# Patient Record
Sex: Male | Born: 1970 | Race: Black or African American | Hispanic: No | Marital: Single | Smoking: Current every day smoker
Health system: Southern US, Community
[De-identification: ages and names within clinical notes are randomized; demographics above are authoritative.]

## PROBLEM LIST (undated history)

## (undated) DIAGNOSIS — N200 Calculus of kidney: Secondary | ICD-10-CM

## (undated) DIAGNOSIS — J45909 Unspecified asthma, uncomplicated: Secondary | ICD-10-CM

---

## 2013-02-02 ENCOUNTER — Emergency Department (HOSPITAL_COMMUNITY): Payer: Self-pay

## 2013-02-02 ENCOUNTER — Inpatient Hospital Stay (HOSPITAL_COMMUNITY)
Admission: EM | Admit: 2013-02-02 | Discharge: 2013-02-04 | DRG: 501 | Disposition: A | Discharge status: Home / Self Care | Payer: MEDICAID | Attending: Emergency Medicine | Admitting: Emergency Medicine

## 2013-02-02 ENCOUNTER — Emergency Department (HOSPITAL_COMMUNITY): Payer: Self-pay | Admitting: Anesthesiology

## 2013-02-02 ENCOUNTER — Encounter (HOSPITAL_COMMUNITY): Admission: EM | Disposition: A | Discharge status: Home / Self Care | Payer: Self-pay | Source: Home / Self Care

## 2013-02-02 ENCOUNTER — Encounter (HOSPITAL_COMMUNITY): Payer: Self-pay

## 2013-02-02 ENCOUNTER — Encounter (HOSPITAL_COMMUNITY): Payer: Self-pay | Admitting: Anesthesiology

## 2013-02-02 DIAGNOSIS — W268XXA Contact with other sharp object(s), not elsewhere classified, initial encounter: Secondary | ICD-10-CM | POA: Diagnosis present

## 2013-02-02 DIAGNOSIS — S6000XA Contusion of unspecified finger without damage to nail, initial encounter: Secondary | ICD-10-CM | POA: Diagnosis present

## 2013-02-02 DIAGNOSIS — S61209A Unspecified open wound of unspecified finger without damage to nail, initial encounter: Secondary | ICD-10-CM | POA: Diagnosis present

## 2013-02-02 DIAGNOSIS — IMO0002 Reserved for concepts with insufficient information to code with codable children: Principal | ICD-10-CM | POA: Diagnosis present

## 2013-02-02 DIAGNOSIS — F17200 Nicotine dependence, unspecified, uncomplicated: Secondary | ICD-10-CM | POA: Diagnosis present

## 2013-02-02 DIAGNOSIS — S65509A Unspecified injury of blood vessel of unspecified finger, initial encounter: Secondary | ICD-10-CM | POA: Diagnosis present

## 2013-02-02 DIAGNOSIS — S62502B Fracture of unspecified phalanx of left thumb, initial encounter for open fracture: Secondary | ICD-10-CM

## 2013-02-02 DIAGNOSIS — S62319A Displaced fracture of base of unspecified metacarpal bone, initial encounter for closed fracture: Secondary | ICD-10-CM | POA: Diagnosis present

## 2013-02-02 HISTORY — DX: Unspecified asthma, uncomplicated: J45.909

## 2013-02-02 HISTORY — PX: OPEN REDUCTION INTERNAL FIXATION (ORIF) METACARPAL: SHX6234

## 2013-02-02 HISTORY — PX: NERVE, TENDON AND ARTERY REPAIR: SHX5695

## 2013-02-02 HISTORY — PX: INCISION AND DRAINAGE: SHX5863

## 2013-02-02 LAB — BASIC METABOLIC PANEL
BUN: 14 mg/dL (ref 6–23)
CO2: 26 mEq/L (ref 19–32)
GFR calc non Af Amer: >90 mL/min (ref >90)
Glucose, Bld: 95 mg/dL (ref 70–99)
Potassium: 4.1 mEq/L (ref 3.5–5.1)

## 2013-02-02 LAB — CBC
HCT: 37.6 % — ABNORMAL LOW (ref 39.0–52.0)
Hemoglobin: 12.9 g/dL — ABNORMAL LOW (ref 13.0–17.0)
MCH: 23 pg — ABNORMAL LOW (ref 26.0–34.0)
MCHC: 34.3 g/dL (ref 30.0–36.0)
RBC: 5.61 MIL/uL (ref 4.22–5.81)

## 2013-02-02 SURGERY — INCISION AND DRAINAGE
Anesthesia: General | Site: Thumb | Laterality: Left | Wound class: Dirty or Infected

## 2013-02-02 MED ORDER — SODIUM CHLORIDE 0.9 % IV BOLUS (SEPSIS)
1000.0000 mL | Freq: Once | INTRAVENOUS | Status: AC
  Administered 2013-02-02: 1000 mL via INTRAVENOUS

## 2013-02-02 MED ORDER — KETOROLAC TROMETHAMINE 30 MG/ML IJ SOLN
15.0000 mg | Freq: Once | INTRAMUSCULAR | Status: AC | PRN
  Administered 2013-02-02: 30 mg via INTRAVENOUS

## 2013-02-02 MED ORDER — CEFAZOLIN SODIUM-DEXTROSE 2-3 GM-% IV SOLR
2.0000 g | Freq: Once | INTRAVENOUS | Status: AC
  Administered 2013-02-02: 2 g via INTRAVENOUS
  Filled 2013-02-02: qty 50

## 2013-02-02 MED ORDER — MORPHINE SULFATE 4 MG/ML IJ SOLN: 4.0000 mg | Freq: Once | INTRAMUSCULAR | Status: DC

## 2013-02-02 MED ORDER — ALPRAZOLAM 0.5 MG PO TABS: 0.5000 mg | ORAL_TABLET | Freq: Four times a day (QID) | ORAL | Status: DC | PRN

## 2013-02-02 MED ORDER — PROMETHAZINE HCL 12.5 MG RE SUPP
12.5000 mg | Freq: Four times a day (QID) | RECTAL | Status: DC | PRN
  Filled 2013-02-02: qty 1

## 2013-02-02 MED ORDER — FENTANYL CITRATE 0.05 MG/ML IJ SOLN: 25.0000 ug | INTRAMUSCULAR | Status: DC | PRN

## 2013-02-02 MED ORDER — MIDAZOLAM HCL 5 MG/5ML IJ SOLN
INTRAMUSCULAR | Status: DC | PRN
  Administered 2013-02-02: 1 mg via INTRAVENOUS

## 2013-02-02 MED ORDER — CEFAZOLIN SODIUM 1-5 GM-% IV SOLN: 1.0000 g | INTRAVENOUS | Status: DC

## 2013-02-02 MED ORDER — TETANUS-DIPHTH-ACELL PERTUSSIS 5-2.5-18.5 LF-MCG/0.5 IM SUSP
0.5000 mL | Freq: Once | INTRAMUSCULAR | Status: AC
  Administered 2013-02-02: 0.5 mL via INTRAMUSCULAR
  Filled 2013-02-02 (×2): qty 0.5

## 2013-02-02 MED ORDER — ONDANSETRON HCL 4 MG PO TABS: 4.0000 mg | ORAL_TABLET | Freq: Four times a day (QID) | ORAL | Status: DC | PRN

## 2013-02-02 MED ORDER — FAMOTIDINE 20 MG PO TABS
20.0000 mg | ORAL_TABLET | Freq: Two times a day (BID) | ORAL | Status: DC | PRN
  Filled 2013-02-02: qty 1

## 2013-02-02 MED ORDER — CEFAZOLIN SODIUM 1-5 GM-% IV SOLN
1.0000 g | Freq: Three times a day (TID) | INTRAVENOUS | Status: DC
  Administered 2013-02-03 – 2013-02-04 (×5): 1 g via INTRAVENOUS
  Filled 2013-02-02 (×6): qty 50

## 2013-02-02 MED ORDER — BUPIVACAINE HCL (PF) 0.5 % IJ SOLN
INTRAMUSCULAR | Status: AC
  Filled 2013-02-02: qty 60

## 2013-02-02 MED ORDER — PROPOFOL 10 MG/ML IV BOLUS
INTRAVENOUS | Status: DC | PRN
  Administered 2013-02-02: 25 mg via INTRAVENOUS
  Administered 2013-02-02: 175 mg via INTRAVENOUS
  Administered 2013-02-02: 20 mg via INTRAVENOUS
  Administered 2013-02-02: 25 mg via INTRAVENOUS

## 2013-02-02 MED ORDER — MORPHINE SULFATE 4 MG/ML IJ SOLN
4.0000 mg | Freq: Once | INTRAMUSCULAR | Status: AC
  Administered 2013-02-02: 4 mg via INTRAVENOUS
  Filled 2013-02-02: qty 1

## 2013-02-02 MED ORDER — GENTAMICIN SULFATE 40 MG/ML IJ SOLN
490.0000 mg | INTRAVENOUS | Status: AC
  Administered 2013-02-02 – 2013-02-03 (×2): 490 mg via INTRAVENOUS
  Filled 2013-02-02 (×3): qty 12.25

## 2013-02-02 MED ORDER — DEXTRAN 40 IN D5W 10 % IV SOLN
8.3000 mL/h | INTRAVENOUS | Status: DC
  Filled 2013-02-02: qty 500

## 2013-02-02 MED ORDER — OXYCODONE HCL 5 MG PO TABS
5.0000 mg | ORAL_TABLET | ORAL | Status: DC | PRN
  Administered 2013-02-03 – 2013-02-04 (×5): 10 mg via ORAL
  Filled 2013-02-02 (×5): qty 2

## 2013-02-02 MED ORDER — EPHEDRINE SULFATE 50 MG/ML IJ SOLN
INTRAMUSCULAR | Status: DC | PRN
  Administered 2013-02-02: 5 mg via INTRAVENOUS

## 2013-02-02 MED ORDER — LACTATED RINGERS IV SOLN
INTRAVENOUS | Status: DC | PRN
  Administered 2013-02-02: 18:00:00 via INTRAVENOUS

## 2013-02-02 MED ORDER — NEOSTIGMINE METHYLSULFATE 1 MG/ML IJ SOLN
INTRAMUSCULAR | Status: DC | PRN
  Administered 2013-02-02: 5 mg via INTRAVENOUS

## 2013-02-02 MED ORDER — PROMETHAZINE HCL 25 MG/ML IJ SOLN: 6.2500 mg | INTRAMUSCULAR | Status: DC | PRN

## 2013-02-02 MED ORDER — CISATRACURIUM BESYLATE (PF) 10 MG/5ML IV SOLN
INTRAVENOUS | Status: DC | PRN
  Administered 2013-02-02: 6 mg via INTRAVENOUS

## 2013-02-02 MED ORDER — SODIUM CHLORIDE 0.9 % IV SOLN
INTRAVENOUS | Status: DC | PRN
  Administered 2013-02-02: 17:00:00 via INTRAVENOUS

## 2013-02-02 MED ORDER — LIDOCAINE HCL (CARDIAC) 20 MG/ML IV SOLN
INTRAVENOUS | Status: DC | PRN
  Administered 2013-02-02: 75 mg via INTRAVENOUS

## 2013-02-02 MED ORDER — CEFAZOLIN SODIUM 1-5 GM-% IV SOLN
1.0000 g | Freq: Once | INTRAVENOUS | Status: AC
  Administered 2013-02-02: 1 g via INTRAVENOUS
  Filled 2013-02-02: qty 50

## 2013-02-02 MED ORDER — LIDOCAINE HCL 2 % IJ SOLN
INTRAMUSCULAR | Status: AC
  Filled 2013-02-02: qty 40

## 2013-02-02 MED ORDER — ACETAMINOPHEN 10 MG/ML IV SOLN
INTRAVENOUS | Status: DC | PRN
  Administered 2013-02-02: 1000 mg via INTRAVENOUS

## 2013-02-02 MED ORDER — KETOROLAC TROMETHAMINE 30 MG/ML IJ SOLN
INTRAMUSCULAR | Status: AC
  Filled 2013-02-02: qty 1

## 2013-02-02 MED ORDER — GLYCOPYRROLATE 0.2 MG/ML IJ SOLN
INTRAMUSCULAR | Status: DC | PRN
  Administered 2013-02-02: .9 mg via INTRAVENOUS

## 2013-02-02 MED ORDER — METOCLOPRAMIDE HCL 5 MG/ML IJ SOLN
INTRAMUSCULAR | Status: DC | PRN
  Administered 2013-02-02: 5 mg via INTRAVENOUS

## 2013-02-02 MED ORDER — SODIUM CHLORIDE 0.9 % IR SOLN
Status: DC | PRN
  Administered 2013-02-02: 18:00:00

## 2013-02-02 MED ORDER — FENTANYL CITRATE 0.05 MG/ML IJ SOLN
INTRAMUSCULAR | Status: DC | PRN
  Administered 2013-02-02 (×2): 100 ug via INTRAVENOUS

## 2013-02-02 MED ORDER — LACTATED RINGERS IV SOLN: INTRAVENOUS | Status: DC

## 2013-02-02 MED ORDER — ONDANSETRON HCL 4 MG/2ML IJ SOLN: 4.0000 mg | Freq: Four times a day (QID) | INTRAMUSCULAR | Status: DC | PRN

## 2013-02-02 MED ORDER — ACETAMINOPHEN 10 MG/ML IV SOLN
INTRAVENOUS | Status: AC
  Filled 2013-02-02: qty 100

## 2013-02-02 MED ORDER — CEFAZOLIN SODIUM-DEXTROSE 2-3 GM-% IV SOLR
INTRAVENOUS | Status: AC
  Filled 2013-02-02: qty 50

## 2013-02-02 MED ORDER — SUCCINYLCHOLINE CHLORIDE 20 MG/ML IJ SOLN
INTRAMUSCULAR | Status: DC | PRN
  Administered 2013-02-02: 120 mg via INTRAVENOUS

## 2013-02-02 MED ORDER — VITAMIN C 500 MG PO TABS
1000.0000 mg | ORAL_TABLET | Freq: Every day | ORAL | Status: DC
  Administered 2013-02-03 – 2013-02-04 (×2): 1000 mg via ORAL
  Filled 2013-02-02 (×2): qty 2

## 2013-02-02 MED ORDER — SODIUM CHLORIDE 0.9 % IV BOLUS (SEPSIS): 500.0000 mL | Freq: Once | INTRAVENOUS | Status: DC

## 2013-02-02 MED ORDER — BACITRACIN-NEOMYCIN-POLYMYXIN 400-5-5000 EX OINT
TOPICAL_OINTMENT | CUTANEOUS | Status: AC
  Filled 2013-02-02: qty 1

## 2013-02-02 MED ORDER — DOCUSATE SODIUM 100 MG PO CAPS
100.0000 mg | ORAL_CAPSULE | Freq: Two times a day (BID) | ORAL | Status: DC
  Administered 2013-02-02 – 2013-02-04 (×4): 100 mg via ORAL

## 2013-02-02 MED ORDER — ONDANSETRON HCL 4 MG/2ML IJ SOLN
INTRAMUSCULAR | Status: DC | PRN
  Administered 2013-02-02: 4 mg via INTRAVENOUS

## 2013-02-02 MED ORDER — DEXAMETHASONE SODIUM PHOSPHATE 10 MG/ML IJ SOLN
INTRAMUSCULAR | Status: DC | PRN
  Administered 2013-02-02: 10 mg via INTRAVENOUS

## 2013-02-02 MED ORDER — METHOCARBAMOL 100 MG/ML IJ SOLN
500.0000 mg | Freq: Four times a day (QID) | INTRAVENOUS | Status: DC | PRN
  Filled 2013-02-02: qty 5

## 2013-02-02 MED ORDER — MORPHINE SULFATE 10 MG/ML IJ SOLN: 1.0000 mg | INTRAMUSCULAR | Status: DC | PRN

## 2013-02-02 MED ORDER — ASPIRIN 325 MG PO TABS
325.0000 mg | ORAL_TABLET | Freq: Two times a day (BID) | ORAL | Status: DC
  Administered 2013-02-02 – 2013-02-04 (×4): 325 mg via ORAL
  Filled 2013-02-02 (×6): qty 1

## 2013-02-02 MED ORDER — POTASSIUM CHLORIDE 2 MEQ/ML IV SOLN
INTRAVENOUS | Status: DC
  Administered 2013-02-02: 21:00:00 via INTRAVENOUS
  Filled 2013-02-02 (×6): qty 1000

## 2013-02-02 MED ORDER — MORPHINE SULFATE 2 MG/ML IJ SOLN
1.0000 mg | INTRAMUSCULAR | Status: DC | PRN
  Administered 2013-02-03: 1 mg via INTRAVENOUS
  Filled 2013-02-02: qty 1

## 2013-02-02 MED ORDER — METHOCARBAMOL 500 MG PO TABS
500.0000 mg | ORAL_TABLET | Freq: Four times a day (QID) | ORAL | Status: DC | PRN
  Administered 2013-02-04: 500 mg via ORAL
  Filled 2013-02-02: qty 1

## 2013-02-02 SURGICAL SUPPLY — 71 items
BAG ZIPLOCK 12X15 (MISCELLANEOUS) ×2 IMPLANT
BANDAGE CONFORM 3  STR LF (GAUZE/BANDAGES/DRESSINGS) ×2 IMPLANT
BANDAGE ELASTIC 3 VELCRO ST LF (GAUZE/BANDAGES/DRESSINGS) ×4 IMPLANT
BANDAGE ELASTIC 4 VELCRO ST LF (GAUZE/BANDAGES/DRESSINGS) IMPLANT
BANDAGE GAUZE ELAST BULKY 4 IN (GAUZE/BANDAGES/DRESSINGS) ×2 IMPLANT
BLADE OSCILLATING/SAGITTAL (BLADE) ×1
BLADE SURG SZ10 CARB STEEL (BLADE) ×2 IMPLANT
BLADE SW THK.38XMED LNG THN (BLADE) ×1 IMPLANT
CLOTH BEACON ORANGE TIMEOUT ST (SAFETY) ×2 IMPLANT
CORDS BIPOLAR (ELECTRODE) ×2 IMPLANT
CUFF TOURN SGL QUICK 18 (TOURNIQUET CUFF) ×2 IMPLANT
CUFF TOURN SGL QUICK 34 (TOURNIQUET CUFF) ×1
CUFF TRNQT CYL 34X4X40X1 (TOURNIQUET CUFF) ×1 IMPLANT
DECANTER SPIKE VIAL GLASS SM (MISCELLANEOUS) ×2 IMPLANT
DEPRESSOR TONGUE BLADE STERILE (MISCELLANEOUS) ×2 IMPLANT
DRAIN PENROSE 18X1/2 LTX STRL (DRAIN) IMPLANT
DRAPE LG THREE QUARTER DISP (DRAPES) ×2 IMPLANT
DRAPE OEC MINIVIEW 54X84 (DRAPES) ×2 IMPLANT
DRAPE SURG 17X11 SM STRL (DRAPES) ×2 IMPLANT
DRAPE U-SHAPE 47X51 STRL (DRAPES) ×2 IMPLANT
DRSG ADAPTIC 3X8 NADH LF (GAUZE/BANDAGES/DRESSINGS) ×2 IMPLANT
DRSG EMULSION OIL 3X16 NADH (GAUZE/BANDAGES/DRESSINGS) ×2 IMPLANT
DRSG PAD ABDOMINAL 8X10 ST (GAUZE/BANDAGES/DRESSINGS) ×4 IMPLANT
ELECT REM PT RETURN 9FT ADLT (ELECTROSURGICAL) ×2
ELECTRODE REM PT RTRN 9FT ADLT (ELECTROSURGICAL) ×1 IMPLANT
GAUZE SPONGE 4X4 16PLY XRAY LF (GAUZE/BANDAGES/DRESSINGS) ×2 IMPLANT
GAUZE XEROFORM 1X8 LF (GAUZE/BANDAGES/DRESSINGS) ×2 IMPLANT
GAUZE XEROFORM 4X4 STRL (GAUZE/BANDAGES/DRESSINGS) ×2 IMPLANT
GLOVE BIO SURGEON STRL SZ8 (GLOVE) ×2 IMPLANT
GLOVE BIOGEL M STRL SZ7.5 (GLOVE) ×2 IMPLANT
GOWN STRL REIN XL XLG (GOWN DISPOSABLE) ×2 IMPLANT
K-WIRE .45 DIA 4 LENGTH (WIRE) ×4 IMPLANT
KIT BASIN OR (CUSTOM PROCEDURE TRAY) ×2 IMPLANT
KWIRE 4.0 X .035IN (WIRE) ×4 IMPLANT
MANIFOLD NEPTUNE II (INSTRUMENTS) IMPLANT
NEEDLE HYPO 25X1 1.5 SAFETY (NEEDLE) ×2 IMPLANT
NS IRRIG 1000ML POUR BTL (IV SOLUTION) ×2 IMPLANT
PACK LOWER EXTREMITY WL (CUSTOM PROCEDURE TRAY) ×2 IMPLANT
PAD CAST 3X4 CTTN HI CHSV (CAST SUPPLIES) ×1 IMPLANT
PAD CAST 4YDX4 CTTN HI CHSV (CAST SUPPLIES) ×2 IMPLANT
PADDING CAST COTTON 3X4 STRL (CAST SUPPLIES) ×1
PADDING CAST COTTON 4X4 STRL (CAST SUPPLIES) ×2
PASSER SUT SWANSON 36MM LOOP (INSTRUMENTS) ×2 IMPLANT
POSITIONER SURGICAL ARM (MISCELLANEOUS) ×2 IMPLANT
SOL PREP POV-IOD 16OZ 10% (MISCELLANEOUS) IMPLANT
SOL PREP PROV IODINE SCRUB 4OZ (MISCELLANEOUS) IMPLANT
SPEAR EYE SURGICAL ST (MISCELLANEOUS) ×2 IMPLANT
SPLINT FIBERGLASS 4X15 (CAST SUPPLIES) ×2 IMPLANT
SPONGE GAUZE 4X4 12PLY (GAUZE/BANDAGES/DRESSINGS) ×2 IMPLANT
STOCKINETTE 4X48 STRL (DRAPES) ×2 IMPLANT
STOCKINETTE TUBULAR SYNTH 3IN (CAST SUPPLIES) ×2 IMPLANT
SUT CHROMIC 5 0 P 3 (SUTURE) ×2 IMPLANT
SUT ETHILON 9 0 V 100.4 (SUTURE) IMPLANT
SUT FIBERWIRE 4-0 18 DIAM BLUE (SUTURE) ×2
SUT MERSILENE 4 0 P 3 (SUTURE) IMPLANT
SUT PROLENE 3 0 PS 2 (SUTURE) IMPLANT
SUT PROLENE 4 0 PS 2 18 (SUTURE) IMPLANT
SUT PROLENE 5 0 P 3 (SUTURE) ×2 IMPLANT
SUT SILK 2 0 (SUTURE)
SUT SILK 2-0 18XBRD TIE 12 (SUTURE) IMPLANT
SUT VIC AB 1 CT1 27 (SUTURE)
SUT VIC AB 1 CT1 27XBRD ANTBC (SUTURE) IMPLANT
SUT VIC AB 2-0 CT1 27 (SUTURE)
SUT VIC AB 2-0 CT1 27XBRD (SUTURE) IMPLANT
SUT VIC AB 2-0 CT1 TAPERPNT 27 (SUTURE) IMPLANT
SUTURE FIBERWR 4-0 18 DIA BLUE (SUTURE) ×1 IMPLANT
SYR 20CC LL (SYRINGE) ×2 IMPLANT
SYR CONTROL 10ML LL (SYRINGE) IMPLANT
TOWEL OR 17X26 10 PK STRL BLUE (TOWEL DISPOSABLE) ×2 IMPLANT
TRAY PREP A LATEX SAFE STRL (SET/KITS/TRAYS/PACK) ×2 IMPLANT
WATER STERILE IRR 1500ML POUR (IV SOLUTION) ×2 IMPLANT

## 2013-02-02 NOTE — Transfer of Care (Signed)
Immediate Anesthesia Transfer of Care Note  Patient: Jimmy Mcbride  Procedure(s) Performed: Procedure(s) (LRB): INCISION AND dedribment (Left) OPEN REDUCTION INTERNAL FIXATION (ORIF) proximal thumb (Left) epl  TENDON repair (Left)  Patient Location: PACU  Anesthesia Type: General  Level of Consciousness: sedated, patient cooperative and responds to stimulaton  Airway & Oxygen Therapy: Patient Spontanous Breathing and Patient connected to face mask oxgen  Post-op Assessment: Report given to PACU RN and Post -op Vital signs reviewed and stable  Post vital signs: Reviewed and stable  Complications: No apparent anesthesia complications

## 2013-02-02 NOTE — Op Note (Signed)
Dictation # [id number] Dominica Severin MD

## 2013-02-02 NOTE — Anesthesia Postprocedure Evaluation (Signed)
  Anesthesia Post-op Note  Patient: Jimmy Mcbride  Procedure(s) Performed: Procedure(s) (LRB): INCISION AND dedribment (Left) OPEN REDUCTION INTERNAL FIXATION (ORIF) proximal thumb (Left) epl  TENDON repair (Left)  Patient Location: PACU  Anesthesia Type: General  Level of Consciousness: awake and alert   Airway and Oxygen Therapy: Patient Spontanous Breathing  Post-op Pain: mild  Post-op Assessment: Post-op Vital signs reviewed, Patient's Cardiovascular Status Stable, Respiratory Function Stable, Patent Airway and No signs of Nausea or vomiting  Last Vitals:  Filed Vitals:   02/02/13 1900  BP:   Pulse:   Temp: 36.4 C  Resp:     Post-op Vital Signs: stable   Complications: No apparent anesthesia complications

## 2013-02-02 NOTE — Progress Notes (Signed)
ANTIBIOTIC CONSULT NOTE - INITIAL  Pharmacy Consult for Vancomycin Indication: post-op wound contamination (x48h only)  Allergies  Allergen Reactions  . Penicillins Hives    Patient Measurements: Height: 5\' 7"  (170.2 cm) Weight: 155 lb (70.308 kg) IBW/kg (Calculated) : 66.1  Vital Signs: Temp: 98 F (36.7 C) (05/27 2036) Temp src: Oral (05/27 1355) BP: 112/72 mmHg (05/27 2036) Pulse Rate: 54 (05/27 2036) Intake/Output from previous day:   Intake/Output from this shift:    Labs:  Recent Labs  02/02/13 1240  WBC 7.3  HGB 12.9*  PLT 235  CREATININE 0.84   Estimated Creatinine Clearance: 109.3 ml/min (by C-G formula based on Cr of 0.84). No results found for this basename: VANCOTROUGH, VANCOPEAK, VANCORANDOM, GENTTROUGH, GENTPEAK, GENTRANDOM, TOBRATROUGH, TOBRAPEAK, TOBRARND, AMIKACINPEAK, AMIKACINTROU, AMIKACIN,  in the last 72 hours   Microbiology: No results found for this or any previous visit (from the past 720 hour(s)).  Medical History: Past Medical History  Diagnosis Date  . Asthma     in childhood    Assessment: 42 yom with open fracture of L thumb with tendon injury and complex wound presented 5/27 for I&D, ORIF of proximal thumb and tendon repair. MD ordered for 48 hours of gentamicin for contamination of wound post-op.   Pt wts 70.3 kg, CrCl > 100 ml/min. Afebrile, WBC wnl, no cultures available   Plan:   Gentamicin 490 mg (7mg /kg) daily x 2 doses   Pharmacy will f/u  Geoffry Paradise, PharmD, BCPS Pager: [phone] 8:49 PM Pharmacy #: [id number]

## 2013-02-02 NOTE — ED Provider Notes (Signed)
Medical screening examination/treatment/procedure(s) were conducted as a shared visit with non-physician practitioner(s) and myself.  I personally evaluated the patient during the encounter.   Patient with crush injury to resulting in comminuted fracture of the proximal phalanx with a large overlying laceration indicating open fracture. Patient will require surgical intervention.  Gilda Crease, MD 02/02/13 1540

## 2013-02-02 NOTE — Progress Notes (Signed)
All pacu notes were charted by Deiondre Harrower rn as pt caregiver.

## 2013-02-02 NOTE — H&P (Signed)
Jimmy Mcbride is an 42 y.o. male.   Chief Complaint: Open fracture left thumb with tendon injury and complex wound HPI: Patient presents with an open left thumb fracture and soft tissues in disarray. Patient notes severe pain he denies other injury. I've reviewed all pertinent issues x-rays et Karie Soda. The injury happened late this morning. Marland Kitchen.Patient presents for evaluation and treatment of the of their upper extremity predicament. The patient denies neck back chest or of abdominal pain. The patient notes that they have no lower extremity problems. The patient from primarily complains of the upper extremity pain noted.  Past Medical History  Diagnosis Date  . Asthma     in childhood    History reviewed. No pertinent past surgical history.  History reviewed. No pertinent family history. Social History:  reports that he has been smoking Cigars.  He has never used smokeless tobacco. He reports that he uses illicit drugs (Marijuana). He reports that he does not drink alcohol.  Allergies:  Allergies  Allergen Reactions  . Penicillins Hives     (Not in a hospital admission)  Results for orders placed during the hospital encounter of 02/02/13 (from the past 48 hour(s))  CBC     Status: Abnormal   Collection Time    02/02/13 12:40 PM      Result Value Range   WBC 7.3  4.0 - 10.5 K/uL   RBC 5.61  4.22 - 5.81 MIL/uL   Hemoglobin 12.9 (*) 13.0 - 17.0 g/dL   HCT 16.1 (*) 09.6 - 04.5 %   MCV 67.0 (*) 78.0 - 100.0 fL   MCH 23.0 (*) 26.0 - 34.0 pg   MCHC 34.3  30.0 - 36.0 g/dL   RDW 40.9  81.1 - 91.4 %   Platelets 235  150 - 400 K/uL  BASIC METABOLIC PANEL     Status: None   Collection Time    02/02/13 12:40 PM      Result Value Range   Sodium 139  135 - 145 mEq/L   Potassium 4.1  3.5 - 5.1 mEq/L   Chloride 104  96 - 112 mEq/L   CO2 26  19 - 32 mEq/L   Glucose, Bld 95  70 - 99 mg/dL   BUN 14  6 - 23 mg/dL   Creatinine, Ser 7.82  0.50 - 1.35 mg/dL   Calcium 9.2  8.4 - 95.6 mg/dL    GFR calc non Af Amer >90  >90 mL/min   GFR calc Af Amer >90  >90 mL/min   Comment:            The eGFR has been calculated     using the CKD EPI equation.     This calculation has not been     validated in all clinical     situations.     eGFR's persistently     <90 mL/min signify     possible Chronic Kidney Disease.   Dg Chest 2 View  02/02/2013   *RADIOLOGY REPORT*  Clinical Data: Preoperative evaluation, smoker  CHEST - 2 VIEW  Comparison:  None.  Findings:  The heart size and mediastinal contours are within normal limits.  Both lungs are clear.  The visualized skeletal structures are unremarkable.  IMPRESSION: No active cardiopulmonary disease.   Original Report Authenticated By: Judie Petit. Miles Costain, M.D.   Dg Finger Thumb Left  02/02/2013   *RADIOLOGY REPORT*  Clinical Data: History of injury.  LEFT THUMB 2+V  Comparison: None.  Findings: There  is a comminuted fracture of the proximal phalanx of the thumb.  The fracture has a longitudinal component extending proximally to the articular surface.  There is some angulation of the major distal fracture fragment.  There is slight distal displacement of major distal fracture fragment.  There is displacement of small comminuted fracture fragments. No dislocation.  There is evidence of soft tissue injury with small focal areas of debris either on the skin or in the superficial soft tissues.  There is soft tissue swelling.  IMPRESSION: Comminuted fracture of the proximal phalanx of thumb. No dislocation.  Soft tissue injury.  Debris either on the skin or in the superficial soft tissues.  There is soft tissue swelling.   Original Report Authenticated By: Onalee Hua Call    Review of Systems  Constitutional: Negative.   HENT: Negative.   Eyes: Negative.   Respiratory: Negative.   Cardiovascular: Negative.   Gastrointestinal: Negative.   Genitourinary: Negative.   Neurological: Negative.   Endo/Heme/Allergies: Negative.   Psychiatric/Behavioral: Negative.      Blood pressure 124/79, pulse 53, temperature 97.7 F (36.5 C), temperature source Oral, resp. rate 16, height 5\' 7"  (1.702 m), weight 70.308 kg (155 lb), SpO2 100.00%. Physical Exam ..The patient is alert and oriented in no acute distress the patient complains of pain in the affected upper extremity.  The patient is noted to have a normal HEENT exam.  Lung fields show equal chest expansion and no shortness of breath  abdomen exam is nontender without distention.  Lower extremity examination does not show any fracture dislocation or blood clot symptoms.  Pelvis is stable neck and back are stable and nontender   patient has an open left thumb fracture with soft tissue disarray. He has refill to the tip of the thumb and some degree of sensory abnormality. He has no evidence of forearm arm or elbow injury. X-ray show very comminuted complex fracture. I reviewed this with him at length.      Assessment/Plan .Marland KitchenWe are planning surgery for your upper extremity. The risk and benefits of surgery include risk of bleeding infection anesthesia damage to normal structures and failure of the surgery to accomplish its intended goals of relieving symptoms and restoring function with this in mind we'll going to proceed. I have specifically discussed with the patient the pre-and postoperative regime and the does and don'ts and risk and benefits in great detail. Risk and benefits of surgery also include risk of dystrophy chronic nerve pain failure of the healing process to go onto completion and other inherent risks of surgery  The relavent the pathophysiology of the disease/injury process, as well as the alternatives for treatment and postoperative course of action has been discussed in great detail with the patient who desires to proceed.  We will do everything in our power to help you (the patient) restore function to the upper extremity. Is a pleasure to see this patient today.   Karen Chafe 02/02/2013, 4:33 PM

## 2013-02-02 NOTE — Progress Notes (Signed)
[facility] has seen patient and provided him with a list of primary care resources. °

## 2013-02-02 NOTE — Anesthesia Preprocedure Evaluation (Addendum)
Anesthesia Evaluation  Patient identified by MRN, date of birth, ID band Patient awake    Reviewed: Allergy & Precautions, H&P , NPO status , Patient's Chart, lab work & pertinent test results  Airway Mallampati: II TM Distance: >3 FB Neck ROM: Full    Dental no notable dental hx.    Pulmonary Current Smoker,  breath sounds clear to auscultation  Pulmonary exam normal       Cardiovascular negative cardio ROS  Rhythm:Regular Rate:Normal     Neuro/Psych negative neurological ROS  negative psych ROS   GI/Hepatic negative GI ROS, Neg liver ROS,   Endo/Other  negative endocrine ROS  Renal/GU negative Renal ROS  negative genitourinary   Musculoskeletal negative musculoskeletal ROS (+)   Abdominal   Peds negative pediatric ROS (+)  Hematology negative hematology ROS (+)   Anesthesia Other Findings   Reproductive/Obstetrics negative OB ROS                           Anesthesia Physical Anesthesia Plan  ASA: II and emergent  Anesthesia Plan: General   Post-op Pain Management:    Induction: Intravenous and Rapid sequence  Airway Management Planned: Oral ETT  Additional Equipment:   Intra-op Plan:   Post-operative Plan: Extubation in OR  Informed Consent: I have reviewed the patients History and Physical, chart, labs and discussed the procedure including the risks, benefits and alternatives for the proposed anesthesia with the patient or authorized representative who has indicated his/her understanding and acceptance.   Dental advisory given  Plan Discussed with: CRNA and Surgeon  Anesthesia Plan Comments:         Anesthesia Quick Evaluation  

## 2013-02-02 NOTE — Preoperative (Signed)
Beta Blockers   Reason not to administer Beta Blockers:Not Applicable 

## 2013-02-02 NOTE — ED Provider Notes (Signed)
History     CSN: [id number]  Arrival date & time 02/02/13  1043   First MD Initiated Contact with Patient 02/02/13 1149      Chief Complaint  Patient presents with  . thumb injury     (Consider location/radiation/quality/duration/timing/severity/associated sxs/prior treatment) HPI Comments: 42 year old male with no significant past medical history presents to the emergency department with an injury to his left thumb that occurred prior to arrival. Patient states that he was moving about 80 pounds of metal when he slipped in causing the melphalan onto his left thumb. Currently he is in severe pain rated 10 out of 10. Unsure of last tetanus shot. He has not had any alleviating factors for his pain. Denies lightheadedness, dizziness, nausea or vomiting.  The history is provided by the patient.    History reviewed. No pertinent past medical history.  History reviewed. No pertinent past surgical history.  History reviewed. No pertinent family history.  History  Substance Use Topics  . Smoking status: Current Every Day Smoker -- 0.50 packs/day    Types: Cigars  . Smokeless tobacco: Never Used  . Alcohol Use: No      Review of Systems  Gastrointestinal: Negative for nausea and vomiting.  Musculoskeletal:       Positive for left thumb pain.  Skin: Positive for wound.  Neurological: Negative for dizziness and light-headedness.  All other systems reviewed and are negative.    Allergies  Review of patient's allergies indicates no known allergies.  Home Medications   Current Outpatient Rx  Name  Route  Sig  Dispense  Refill  . ALPRAZolam (XANAX) 1 MG tablet   Oral   Take 1 mg by mouth at bedtime as needed for sleep.         Marland Kitchen HYDROcodone-acetaminophen (NORCO) 10-325 MG per tablet   Oral   Take 1 tablet by mouth every 6 (six) hours as needed for pain.           BP 123/79  Pulse 61  Temp(Src) 97.6 F (36.4 C) (Oral)  Resp 16  Ht 5\' 7"  (1.702 m)  Wt 155 lb  (70.308 kg)  BMI 24.27 kg/m2  SpO2 100%  Physical Exam  Nursing note and vitals reviewed. Constitutional: He is oriented to person, place, and time. He appears well-developed and well-nourished. No distress.  HENT:  Head: Normocephalic and atraumatic.  Mouth/Throat: Oropharynx is clear and moist.  Eyes: Conjunctivae are normal.  Neck: Normal range of motion. Neck supple.  Cardiovascular: Normal rate, regular rhythm, normal heart sounds and intact distal pulses.   Pulmonary/Chest: Effort normal and breath sounds normal. No respiratory distress.  Musculoskeletal:  See skin  Neurological: He is alert and oriented to person, place, and time.  Skin: Skin is warm. He is not diaphoretic.  Jagged laceration to palmar aspect of left thumb extending into bone. ROM intact at MCP, no other thumb ROM.  Psychiatric: He has a normal mood and affect. His behavior is normal.    ED Course  Procedures (including critical care time)  Labs Reviewed  CBC  BASIC METABOLIC PANEL   Dg Finger Thumb Left  02/02/2013   *RADIOLOGY REPORT*  Clinical Data: History of injury.  LEFT THUMB 2+V  Comparison: None.  Findings: There is a comminuted fracture of the proximal phalanx of the thumb.  The fracture has a longitudinal component extending proximally to the articular surface.  There is some angulation of the major distal fracture fragment.  There is slight distal displacement  of major distal fracture fragment.  There is displacement of small comminuted fracture fragments. No dislocation.  There is evidence of soft tissue injury with small focal areas of debris either on the skin or in the superficial soft tissues.  There is soft tissue swelling.  IMPRESSION: Comminuted fracture of the proximal phalanx of thumb. No dislocation.  Soft tissue injury.  Debris either on the skin or in the superficial soft tissues.  There is soft tissue swelling.   Original Report Authenticated By: Onalee Hua Call     1. Open fracture of  thumb, left, initial encounter       MDM  42 y/o male with open comminuted fracture of left thumb. I spoke with Dr. Butler Denmark who will have his PA evaluate patient, advised pre-op EKG and CXR as he will be taken to OR. Patient NPO, ancef stated, iv fluids, morphine. Patient also evaluated by Dr. Blinda Leatherwood who agrees with plan of care.    Trevor Mace, PA-C 02/02/13 [phone]

## 2013-02-02 NOTE — ED Provider Notes (Signed)
EKG:  Date: 02/02/2013  Rate: 53  Rhythm: normal sinus rhythm  QRS Axis: normal  Intervals: normal  ST/T Wave abnormalities: normal  Conduction Disutrbances:none     Gilda Crease, MD 02/02/13 1357

## 2013-02-02 NOTE — ED Notes (Signed)
Patient reports that he was moving approx 80 lbs of metal and slipped causing the metal to land on his left thumb

## 2013-02-02 NOTE — Anesthesia Postprocedure Evaluation (Deleted)
  Anesthesia Post-op Note  Patient: Jimmy Mcbride  Procedure(s) Performed: Procedure(s) (LRB): INCISION AND dedribment (Left) OPEN REDUCTION INTERNAL FIXATION (ORIF) proximal thumb (Left) epl  TENDON repair (Left)  Patient Location: PACU  Anesthesia Type: General  Level of Consciousness: awake and alert   Airway and Oxygen Therapy: Patient Spontanous Breathing  Post-op Pain: mild  Post-op Assessment: Post-op Vital signs reviewed, Patient's Cardiovascular Status Stable, Respiratory Function Stable, Patent Airway and No signs of Nausea or vomiting  Last Vitals:  Filed Vitals:   02/02/13 1900  BP:   Pulse:   Temp: 36.4 C  Resp:     Post-op Vital Signs: stable   Complications: No apparent anesthesia complications  

## 2013-02-03 ENCOUNTER — Encounter (HOSPITAL_COMMUNITY): Payer: Self-pay | Admitting: Orthopedic Surgery

## 2013-02-03 NOTE — Progress Notes (Signed)
Subjective: 1 Day Post-Op Procedure(s) (LRB): INCISION AND dedribment (Left) OPEN REDUCTION INTERNAL FIXATION (ORIF) proximal thumb (Left) epl  TENDON repair (Left) Patient reports pain as mild.   Stable  No complaints Tolerating reg diet  Objective: Vital signs in last 24 hours: Temp:  [97.5 F (36.4 C)-98.8 F (37.1 C)] 98.8 F (37.1 C) (05/28 1036) Pulse Rate:  [49-72] 72 (05/28 1036) Resp:  [14-20] 20 (05/28 1036) BP: (87-124)/(42-80) 124/70 mmHg (05/28 1212) SpO2:  [98 %-100 %] 99 % (05/28 1036)  Intake/Output from previous day: 05/27 0701 - 05/28 0700 In: 2776 [P.O.:720; I.V.:1893.8; IV Piggyback:162.3] Out: 1150 [Urine:1150] Intake/Output this shift: Total I/O In: 240 [P.O.:240] Out: 300 [Urine:300]   Recent Labs  02/02/13 1240  HGB 12.9*    Recent Labs  02/02/13 1240  WBC 7.3  RBC 5.61  HCT 37.6*  PLT 235    Recent Labs  02/02/13 1240  NA 139  K 4.1  CL 104  CO2 26  BUN 14  CREATININE 0.84  GLUCOSE 95  CALCIUM 9.2   No results found for this basename: LABPT, INR,  in the last 72 hours  ABD soft Neurovascular intact Intact pulses distally No cellulitis present Compartment soft .Marland KitchenThe patient is alert and oriented in no acute distress the patient complains of pain in the affected upper extremity.  The patient is noted to have a normal HEENT exam.  Lung fields show equal chest expansion and no shortness of breath  abdomen exam is nontender without distention.  Lower extremity examination does not show any fracture dislocation or blood clot symptoms.  Pelvis is stable neck and back are stable and nontender Assessment/Plan: 1 Day Post-Op Procedure(s) (LRB): INCISION AND dedribment (Left) OPEN REDUCTION INTERNAL FIXATION (ORIF) proximal thumb (Left) epl  TENDON repair (Left) Plan for discharge tomorrow Doing well and discussed all issues and post op plans  Will need continued IV ABX and dc tommorrow  Maki Hege III,Keanthony Poole M 02/03/2013,  1:13 PM

## 2013-02-03 NOTE — Op Note (Signed)
Jimmy Mcbride, Jimmy Mcbride            ACCOUNT NO.:  [id number]  MEDICAL RECORD NO.:  [id number]  LOCATION:  1602                         FACILITY:  [facility]  PHYSICIAN:  Dionne Ano. Amaiah Cristiano, M.D.DATE OF BIRTH:  [birth year]  DATE OF PROCEDURE: DATE OF DISCHARGE:                              OPERATIVE REPORT   PREOPERATIVE DIAGNOSIS:  Open left thumb fracture with marked soft tissue disarray including extensor and flexor tendon injury as well as volar plate injury and an open proximal phalanx fracture.  POSTOPERATIVE DIAGNOSIS:  Open left thumb fracture with marked soft tissue disarray including extensor and flexor tendon injury as well as volar plate injury and an open proximal phalanx fracture with noted radial digital nerve contusion and radial digital artery severance.  SURGICAL PROCEDURE: 1. Irrigation and debridement of skin, subcutaneous tissue, tendon,     bone, and associated soft tissue including volar plate.  This was     an excisional debridement. 2. Open reduction and internal fixation, proximal phalanx fracture,     left thumb. 3. Volar plate repair, left thumb interphalangeal joint. 4. Sesamoid removal, left thumb. 5. Radial digital nerve neurolysis, left thumb. 6. Extensor pollicis longus tendon repair, left thumb. 7. Stress radiography. 8. Tenolysis, tenosynovectomy, FPL tendon, which was encased and     contamination and displaced markedly as it was interposed between     the bone and the volar plate (the volar plate completely displaced     secondary to its injury). 9. Partial nail plate removal, left thumb.  SURGEON:  Dionne Ano. Amanda Pea, M.D.  ASSISTANT:  Karie Chimera, P.A.-C.  COMPLICATIONS:  None.  ANESTHESIA:  General.  INDICATIONS:  This is a pleasant 42 year old male who has a horrible injury to his thumb.  He sustained this today when steel object slammed against his nondominant thumb.  He has been given tetanus and preop antibiotics.  He has been  consented for surgical intervention.  He understands risks and benefits and desires to proceed.  All questions have been encouraged and answered preoperatively.  OPERATIVE PROCEDURE:  The patient was seen by myself and anesthesia, taken to the operative suite, underwent smooth induction of general anesthetic, laid supine, appropriately prepped and draped in the sterile fashion with 2 separate Betadine scrub and paints performed by myself and Mr. Wynona Neat, P.A.-C.  Following this, we performed a sterile dressing application followed by time-out and pre and postop check was being complete.  Preoperative antibiotics were given in the form of 2 g of Ancef at my instruction.  At this juncture, the patient was stable, and operation commenced with irrigation and debridement.  The wound was opened with retractors.  I removed dirt and contamination throughout the volar aspect of the thumb where he had a very significant implosive injury.  He underwent I and D of skin, subcutaneous tissue, tendon, bone, volar plate, which was interposed between the flexor pollicis longus and the soft tissue.  The patient also had the EPL tendon in the volar depths of the wounds.  Interestingly, the EPL had been avulsed from its distal attachment at the distal phalanx and was in the volar aspect of the wound.  One could tug on it, and this certainly created  the preoperative picture of loss of extension completely.  We performed 8 L of saline in the wound.  We picked out all dirty contamination.  Following the I and D, we then identified the volar plate and performed excision of sesamoid bone at the IP joint, which was loose and frayed. We performed the removal without difficulty as we felt this would be in his best interest given the fact that he had minimal soft tissue attachments here.  This was a sesamoid excision.  Following this, a volar plate was repaired.  It was set back in its groove so that it provided  a buffer between the FPL and the volar plate.  After repairing the volar plate, we then performed additional repairs to the FPL and tenolysis tenosynovectomy.  The patient had marked contamination about it, and this was addressed with the I and D.  There was fraying and a significant injury in general to the tendon although it did not require repair for complete severance.  As opposed to the EPL, which was completely shredded and severed, the FPL was noted to have fraying and shredding around the edges, which were cleaned up with tenolysis tenosynovectomy.  Following this, we performed a radial digital nerve neurolysis.  It was noted to be highly contused, but was able to be placed in a tension-free environment.  The radial digital artery unfortunately was lacerated.  I did not attempt repair as there was segmental defect and very poor quality.  The patient did have a strip of ulnar tissue, and I felt the ulnar digital nerve and artery were intact as the zone of injury did not complete itself to their conscience.  Following this, we then reduced the fracture, and I felt that the only way to truly harness this and provide for EPL repair would be to pin the IP joint.  Thus I placed two 0.045 K-wires from distal to proximal engaging the distal and proximal phalanx with the joint at 0 degrees.  I was able to pin this to my satisfaction very nicely and was quite pleased with this.  Following this, x-rays were taken to document this under stress radiography.  Once this was done, I then performed very careful and cautious look to the EPL.  It had previously been placed in a dorsal position from the volarly displaced position after the I and D.  I made a small midline incision.  I went straight down to the bone from the midline position as I did not want to interrupt vascular tributaries (venous outflow). Following this, I then performed a primary repair of the EPL.  It was avulsed from the  distal phalanx much as a mallet injury would.  I placed a 4-strand repair with modified Krackow to Jamie 4-0 FiberWire.  The EPL was completely severed and well out of place.  I was able to achieve a decent fixation.  It is my hope that the patient has a stiff IP joint but one with a jog of motion, and I will plan for 6 weeks of pinning.  The pins were cut below the skin and would require later removal, likely in the operative arena.  At this point, I performed additional one lavage and following this, then performed a careful and cautious approach to the finger with partial removal of the nail bed to make sure vascular integrity and venous outflow would not be a problem.  I removed half the nail plate without difficulty utilizing a Therapist, nutritional.  There was  good refill and no complications.  Thus, the patient underwent the EPL repair, FPL tenolysis tenosynovectomy, volar plate repair, sesamoid excision, ORIF, I and D, radial digital nerve neurolysis, very complex wound closure.  The wounds were tied together with 5-0 chromic and 5-0 Prolene.  There were no complications.  I would give it a guarded prognosis.  However, given the severe mangling and crashing injury to these digits.  We are going to monitor him closely.  I am going to admit him for IV antibiotics, general postop pain control and other measures.  Should any problems arise, he is going to notify me.  Otherwise, we will be monitored his condition very closely on a daily basis.  It was a pleasure to participate in his care.  This is a very unfortunate injury, and I do feel he will be left with some degree of disability in the thumb after this type of a crushing injury.     Dionne Ano. Amanda Pea, M.D.     [facility]  D:  02/02/2013  T:  02/03/2013  Job:  [id number]

## 2013-02-04 MED ORDER — SULFAMETHOXAZOLE-TRIMETHOPRIM 800-160 MG PO TABS: 1.0000 | ORAL_TABLET | Freq: Two times a day (BID) | ORAL | Status: DC

## 2013-02-04 MED ORDER — ASPIRIN 325 MG PO TABS: 325.0000 mg | ORAL_TABLET | Freq: Two times a day (BID) | ORAL | Status: DC

## 2013-02-04 MED ORDER — OXYCODONE HCL 5 MG PO TABS: 5.0000 mg | ORAL_TABLET | ORAL | Status: DC | PRN

## 2013-02-04 NOTE — Discharge Summary (Signed)
Physician Discharge Summary  Patient ID: Jimmy Mcbride: [id number] DOB/AGE: 40-Mar-1972 40 y.o.  Admit date: 02/02/2013 Discharge date: 02/04/2013  Admission Diagnoses: Severe crushing injury about the right thumb  Discharge Diagnoses: Severe crushing injury about the right thumb was noted open fracture about the proximal phalanx as well as EPL tendon disruption and partial flexor tendon injury and artery injury Status post irrigation and debridement about the right thumb with EPL tendon repair, flexor tenosynovectomy and treatment with pinning of an open fracture about the proximal phalanx of the right thumb  Discharged Condition: Improved  Hospital Course: Patient is a pleasant 42 year old gentleman who unfortunately the severe crushing injury to the right thumb he was noted to have an open fracture about the right thumb proximal phalanx with soft tissue disarray and tendon disruption. The patient was seen and evaluated emergency room setting, given the open nature of his fracture tendon injury and neurovascular compromise he was taken to the operative suite where he underwent the above procedure. Please see operative report for details. The patient tolerated procedure well there was no complications. He was transferred to floor in stable condition. He was undergone IV antibiotics given this was an open fracture and the wound was severely contaminated. He continued IV antibiotics greater than 24 hours in duration, postop day #1 the patient was doing well his pain was controlled with his current pain medication regime. He was tolerating later diet and voiding without difficulties. He was noted to have flatus. On postoperative day #2 is much improved he is having minimal pain at that juncture. He had no complaints he was tolerating a regular diet he denied fevers, chills, nausea, vomiting, or other difficulties. We discussed with the patient given this was an open fracture wound to continue oral  antibiotics after his discharge for a 2 week period of time. We discussed with him at length wound care keeping the splint clean and dry, elevate hand frequently. We'll follow him up closely and we'll see him Monday. All questions were encouraged and answered  Consults: None  Treatments: See operative report  Discharge Exam: Blood pressure 128/65, pulse 56, temperature 98.4 F (36.9 C), temperature source Oral, resp. rate 18, height 5\' 7"  (1.702 m), weight 70.308 kg (155 lb), SpO2 100.00%. Marland Kitchen.The patient is alert and oriented in no acute distress the patient complains of pain in the affected upper extremity.  The patient is noted to have a normal HEENT exam.  Lung fields show equal chest expansion and no shortness of breath  abdomen exam is nontender without distention.  Lower extremity examination does not show any fracture dislocation or blood clot symptoms.  Pelvis is stable neck and back are stable and nontender  evaluation of the right upper extremity shows his splint is clean and intact. He has no signs of infection or cellulitis. He has no signs of compartment syndrome  Disposition: Final discharge disposition not confirmed  Discharge Orders   Future Orders Complete By Expires     Call MD / Call 911  As directed     Comments:      If you experience chest pain or shortness of breath, CALL 911 and be transported to the hospital emergency room.  If you develope a fever above 101 F, pus (white drainage) or increased drainage or redness at the wound, or calf pain, call your surgeon's office.    Constipation Prevention  As directed     Comments:      Drink plenty of fluids.  Prune juice may be helpful.  You may use a stool softener, such as Colace (over the counter) 100 mg twice a day.  Use MiraLax (over the counter) for constipation as needed.    Diet - low sodium heart healthy  As directed     Discharge instructions  As directed     Comments:      Marland KitchenMarland KitchenKeep bandage clean and dry.  Call  for any problems.  No smoking.  Criteria for driving a car: you should be off your pain medicine for 7-8 hours, able to drive one handed(confident), thinking clearly and feeling able in your judgement to drive. Continue elevation as it will decrease swelling.  If instructed by MD move your fingers within the confines of the bandage/splint.  Use ice if instructed by your MD. Call immediately for any sudden loss of feeling in your hand/arm or change in functional abilities of the extremity.Marland KitchenMarland KitchenWe recommend that you to take vitamin C 1000 mg a day to promote healing we also recommend that if you require her pain medicine that he take a stool softener to prevent constipation as most pain medicines will have constipation side effects. We recommend either Peri-Colace or Senokot and recommend that you also consider adding MiraLAX to prevent the constipation affects from pain medicine if you are required to use them. These medicines are over the counter and maybe purchased at a local pharmacy.    Increase activity slowly as tolerated  As directed         Medication List    STOP taking these medications       HYDROcodone-acetaminophen 10-325 MG per tablet  Commonly known as:  NORCO      TAKE these medications       ALPRAZolam 1 MG tablet  Commonly known as:  XANAX  Take 1 mg by mouth at bedtime as needed for sleep.     aspirin 325 MG tablet  Take 1 tablet (325 mg total) by mouth 2 (two) times daily.     oxyCODONE 5 MG immediate release tablet  Commonly known as:  Oxy IR/ROXICODONE  Take 1-2 tablets (5-10 mg total) by mouth every 3 (three) hours as needed.     sulfamethoxazole-trimethoprim 800-160 MG per tablet  Commonly known as:  BACTRIM DS  Take 1 tablet by mouth 2 (two) times daily.           Follow-up Information   Follow up with Karen Chafe, MD On 02/08/2013. (follow up monday at 1:30pm)    Contact information:   [address] [place]  [address] [place] [address] [phone]       Signed: Sheran Lawless 02/04/2013, 2:38 PM

## 2013-02-04 NOTE — Progress Notes (Signed)
Pt transported with NT and family to private vehicle.

## 2014-05-30 ENCOUNTER — Emergency Department (HOSPITAL_BASED_OUTPATIENT_CLINIC_OR_DEPARTMENT_OTHER)
Admission: EM | Admit: 2014-05-30 | Discharge: 2014-05-30 | Disposition: A | Discharge status: Home / Self Care | Payer: BC Managed Care – PPO | Attending: Emergency Medicine | Admitting: Emergency Medicine

## 2014-05-30 ENCOUNTER — Encounter (HOSPITAL_BASED_OUTPATIENT_CLINIC_OR_DEPARTMENT_OTHER): Payer: Self-pay | Admitting: Emergency Medicine

## 2014-05-30 DIAGNOSIS — J45909 Unspecified asthma, uncomplicated: Secondary | ICD-10-CM | POA: Diagnosis not present

## 2014-05-30 DIAGNOSIS — R197 Diarrhea, unspecified: Secondary | ICD-10-CM | POA: Diagnosis present

## 2014-05-30 DIAGNOSIS — Z792 Long term (current) use of antibiotics: Secondary | ICD-10-CM | POA: Diagnosis not present

## 2014-05-30 DIAGNOSIS — K5289 Other specified noninfective gastroenteritis and colitis: Secondary | ICD-10-CM | POA: Insufficient documentation

## 2014-05-30 DIAGNOSIS — IMO0001 Reserved for inherently not codable concepts without codable children: Secondary | ICD-10-CM | POA: Insufficient documentation

## 2014-05-30 DIAGNOSIS — Z880 Allergy status to penicillin: Secondary | ICD-10-CM | POA: Insufficient documentation

## 2014-05-30 DIAGNOSIS — Z79899 Other long term (current) drug therapy: Secondary | ICD-10-CM | POA: Diagnosis not present

## 2014-05-30 DIAGNOSIS — K529 Noninfective gastroenteritis and colitis, unspecified: Secondary | ICD-10-CM

## 2014-05-30 DIAGNOSIS — Z7982 Long term (current) use of aspirin: Secondary | ICD-10-CM | POA: Diagnosis not present

## 2014-05-30 DIAGNOSIS — F17200 Nicotine dependence, unspecified, uncomplicated: Secondary | ICD-10-CM | POA: Insufficient documentation

## 2014-05-30 LAB — CBC WITH DIFFERENTIAL/PLATELET
BASOS ABS: 0 10*3/uL (ref 0.0–0.1)
BASOS PCT: 0 % (ref 0–1)
EOS ABS: 0.1 10*3/uL (ref 0.0–0.7)
Eosinophils Relative: 1 % (ref 0–5)
HEMATOCRIT: 39.5 % (ref 39.0–52.0)
HEMOGLOBIN: 13.8 g/dL (ref 13.0–17.0)
LYMPHS PCT: 13 % (ref 12–46)
Lymphs Abs: 0.9 10*3/uL (ref 0.7–4.0)
MCH: 23.2 pg — AB (ref 26.0–34.0)
MCHC: 34.9 g/dL (ref 30.0–36.0)
MCV: 66.4 fL — ABNORMAL LOW (ref 78.0–100.0)
MONO ABS: 0.4 10*3/uL (ref 0.1–1.0)
Monocytes Relative: 6 % (ref 3–12)
NEUTROS ABS: 5.8 10*3/uL (ref 1.7–7.7)
NEUTROS PCT: 80 % — AB (ref 43–77)
Platelets: 217 10*3/uL (ref 150–400)
RBC: 5.95 MIL/uL — ABNORMAL HIGH (ref 4.22–5.81)
RDW: 14.9 % (ref 11.5–15.5)
WBC: 7.2 10*3/uL (ref 4.0–10.5)

## 2014-05-30 LAB — COMPREHENSIVE METABOLIC PANEL
ALBUMIN: 3.8 g/dL (ref 3.5–5.2)
ALT: 11 U/L (ref 0–53)
ANION GAP: 14 (ref 5–15)
AST: 15 U/L (ref 0–37)
Alkaline Phosphatase: 63 U/L (ref 39–117)
BUN: 14 mg/dL (ref 6–23)
CHLORIDE: 102 meq/L (ref 96–112)
CO2: 23 mEq/L (ref 19–32)
CREATININE: 0.8 mg/dL (ref 0.50–1.35)
Calcium: 9 mg/dL (ref 8.4–10.5)
GFR calc Af Amer: >90 mL/min (ref >90)
GFR calc non Af Amer: >90 mL/min (ref >90)
Glucose, Bld: 95 mg/dL (ref 70–99)
Potassium: 4.6 mEq/L (ref 3.7–5.3)
Sodium: 139 mEq/L (ref 137–147)
TOTAL PROTEIN: 7.4 g/dL (ref 6.0–8.3)
Total Bilirubin: 0.5 mg/dL (ref 0.3–1.2)

## 2014-05-30 MED ORDER — SODIUM CHLORIDE 0.9 % IV BOLUS (SEPSIS)
1000.0000 mL | Freq: Once | INTRAVENOUS | Status: AC
  Administered 2014-05-30: 1000 mL via INTRAVENOUS

## 2014-05-30 MED ORDER — PROMETHAZINE HCL 25 MG PO TABS: 25.0000 mg | ORAL_TABLET | Freq: Four times a day (QID) | ORAL | Status: DC | PRN

## 2014-05-30 MED ORDER — ONDANSETRON HCL 4 MG/2ML IJ SOLN
4.0000 mg | Freq: Once | INTRAMUSCULAR | Status: AC
  Administered 2014-05-30: 4 mg via INTRAVENOUS
  Filled 2014-05-30: qty 2

## 2014-05-30 MED ORDER — KETOROLAC TROMETHAMINE 30 MG/ML IJ SOLN
30.0000 mg | Freq: Once | INTRAMUSCULAR | Status: AC
  Administered 2014-05-30: 30 mg via INTRAVENOUS
  Filled 2014-05-30: qty 1

## 2014-05-30 NOTE — ED Notes (Signed)
MD at bedside. 

## 2014-05-30 NOTE — ED Provider Notes (Signed)
CSN: [id number]     Arrival date & time 05/30/14  1519 History  This chart was scribed for Geoffery Lyons, MD by Charline Bills, ED Scribe. The patient was seen in room MH03/MH03. Patient's care was started at 4:26 PM.   Chief Complaint  Patient presents with  . Diarrhea   Patient is a 43 y.o. male presenting with diarrhea. The history is provided by the patient. No language interpreter was used.  Diarrhea Severity:  Moderate Onset quality:  Sudden Associated symptoms: chills and myalgias   Associated symptoms: no fever and no vomiting    HPI Comments: Jimmy Mcbride is a 43 y.o. male who presents to the Emergency Department complaining of sudden onset of diarrhea this morning. Pt reports associated myalgias and generalized abdominal pain. Pt states that he has not been able to eat but he has been able to consume minimal liquids. Last void around 12 PM today. He denies emesis, fever, bloody stools. No sick contacts. No h/o abdominal surgeries.   Past Medical History  Diagnosis Date  . Asthma     in childhood   Past Surgical History  Procedure Laterality Date  . Incision and drainage Left 02/02/2013    Procedure: INCISION AND dedribment;  Surgeon: Dominica Severin, MD;  Location: [facility];  Service: Orthopedics;  Laterality: Left;  . Open reduction internal fixation (orif) metacarpal Left 02/02/2013    Procedure: OPEN REDUCTION INTERNAL FIXATION (ORIF) proximal thumb;  Surgeon: Dominica Severin, MD;  Location: [facility];  Service: Orthopedics;  Laterality: Left;  . Nerve, tendon and artery repair Left 02/02/2013    Procedure: Extensor Pollicis Longus TENDON repair;  Surgeon: Dominica Severin, MD;  Location: [facility];  Service: Orthopedics;  Laterality: Left;   No family history on file. History  Substance Use Topics  . Smoking status: Current Every Day Smoker -- 0.50 packs/day for 15 years    Types: Cigars  . Smokeless tobacco: Never Used  . Alcohol Use: No    Review of Systems   Constitutional: Positive for chills. Negative for fever.  Gastrointestinal: Positive for diarrhea. Negative for vomiting and blood in stool.  Musculoskeletal: Positive for myalgias.  All other systems reviewed and are negative.  Allergies  Penicillins  Home Medications   Prior to Admission medications   Medication Sig Start Date End Date Taking? Authorizing Provider  ALPRAZolam Prudy Feeler) 1 MG tablet Take 1 mg by mouth at bedtime as needed for sleep.    Historical Provider, MD  aspirin 325 MG tablet Take 1 tablet (325 mg total) by mouth 2 (two) times daily. 02/04/13   Sheran Lawless, PA-C  oxyCODONE (OXY IR/ROXICODONE) 5 MG immediate release tablet Take 1-2 tablets (5-10 mg total) by mouth every 3 (three) hours as needed. 02/04/13   Sheran Lawless, PA-C  sulfamethoxazole-trimethoprim (BACTRIM DS) 800-160 MG per tablet Take 1 tablet by mouth 2 (two) times daily. 02/04/13   Sheran Lawless, PA-C   Triage Vitals: BP 113/65  Pulse 87  Temp(Src) 98.5 F (36.9 C) (Oral)  Resp 18  Ht  (1.702 m)  Wt 151 lb (68.493 kg)  BMI 23.64 kg/m2  SpO2 100% Physical Exam  Nursing note and vitals reviewed. Constitutional: He is oriented to person, place, and time. He appears well-developed and well-nourished. No distress.  HENT:  Head: Normocephalic and atraumatic.  Mouth/Throat: Oropharynx is clear and moist.  Eyes: Conjunctivae and EOM are normal.  Neck: Neck supple. No tracheal deviation present.  Cardiovascular: Normal rate.   Pulmonary/Chest:  Effort normal. No respiratory distress.  Abdominal: Soft. Bowel sounds are normal. He exhibits no distension. There is tenderness. There is no rebound and no guarding.  There is mild tenderness to palpation in all 4 quadrants   Musculoskeletal: Normal range of motion.  Neurological: He is alert and oriented to person, place, and time.  Skin: Skin is warm and dry.  Psychiatric: He has a normal mood and affect. His behavior is normal.   ED Course   Procedures (including critical care time) DIAGNOSTIC STUDIES: Oxygen Saturation is 100% on RA, normal by my interpretation.    COORDINATION OF CARE: 4:31 PM-Discussed treatment plan which includes IV fluids with pt at bedside and pt agreed to plan.   Labs Review Labs Reviewed  CBC WITH DIFFERENTIAL - Abnormal; Notable for the following:    RBC 5.95 (*)    MCV 66.4 (*)    MCH 23.2 (*)    Neutrophils Relative % 80 (*)    All other components within normal limits  COMPREHENSIVE METABOLIC PANEL   Imaging Review No results found.   EKG Interpretation None      MDM   Final diagnoses:  None    Patient's presentation, exam, and workup consistent with a viral gastroenteritis. He is feeling better with fluids and medications. He will be discharged to home with when necessary return.  I personally performed the services described in this documentation, which was scribed in my presence. The recorded information has been reviewed and is accurate.    Geoffery Lyons, MD 05/31/14 [id number]

## 2014-05-30 NOTE — ED Notes (Signed)
C/o body aches, chills, diarrhea x today

## 2014-05-30 NOTE — Discharge Instructions (Signed)
Phenergan as prescribed as needed for nausea.  Return to the emergency department if your pain significantly worsens, you develop high fever, bloody stool, or other new and concerning symptoms.   Viral Gastroenteritis Viral gastroenteritis is also known as stomach flu. This condition affects the stomach and intestinal tract. It can cause sudden diarrhea and vomiting. The illness typically lasts 3 to 8 days. Most people develop an immune response that eventually gets rid of the virus. While this natural response develops, the virus can make you quite ill. CAUSES  Many different viruses can cause gastroenteritis, such as rotavirus or noroviruses. You can catch one of these viruses by consuming contaminated food or water. You may also catch a virus by sharing utensils or other personal items with an infected person or by touching a contaminated surface. SYMPTOMS  The most common symptoms are diarrhea and vomiting. These problems can cause a severe loss of body fluids (dehydration) and a body salt (electrolyte) imbalance. Other symptoms may include:  Fever.  Headache.  Fatigue.  Abdominal pain. DIAGNOSIS  Your caregiver can usually diagnose viral gastroenteritis based on your symptoms and a physical exam. A stool sample may also be taken to test for the presence of viruses or other infections. TREATMENT  This illness typically goes away on its own. Treatments are aimed at rehydration. The most serious cases of viral gastroenteritis involve vomiting so severely that you are not able to keep fluids down. In these cases, fluids must be given through an intravenous line (IV). HOME CARE INSTRUCTIONS   Drink enough fluids to keep your urine clear or pale yellow. Drink small amounts of fluids frequently and increase the amounts as tolerated.  Ask your caregiver for specific rehydration instructions.  Avoid:  Foods high in sugar.  Alcohol.  Carbonated drinks.  Tobacco.  Juice.  Caffeine  drinks.  Extremely hot or cold fluids.  Fatty, greasy foods.  Too much intake of anything at one time.  Dairy products until 24 to 48 hours after diarrhea stops.  You may consume probiotics. Probiotics are active cultures of beneficial bacteria. They may lessen the amount and number of diarrheal stools in adults. Probiotics can be found in yogurt with active cultures and in supplements.  Wash your hands well to avoid spreading the virus.  Only take over-the-counter or prescription medicines for pain, discomfort, or fever as directed by your caregiver. Do not give aspirin to children. Antidiarrheal medicines are not recommended.  Ask your caregiver if you should continue to take your regular prescribed and over-the-counter medicines.  Keep all follow-up appointments as directed by your caregiver. SEEK IMMEDIATE MEDICAL CARE IF:   You are unable to keep fluids down.  You do not urinate at least once every 6 to 8 hours.  You develop shortness of breath.  You notice blood in your stool or vomit. This may look like coffee grounds.  You have abdominal pain that increases or is concentrated in one small area (localized).  You have persistent vomiting or diarrhea.  You have a fever.  The patient is a child younger than 3 months, and he or she has a fever.  The patient is a child older than 3 months, and he or she has a fever and persistent symptoms.  The patient is a child older than 3 months, and he or she has a fever and symptoms suddenly get worse.  The patient is a baby, and he or she has no tears when crying. MAKE SURE YOU:  Understand these instructions.  Will watch your condition.  Will get help right away if you are not doing well or get worse. Document Released: 08/26/2005 Document Revised: 11/18/2011 Document Reviewed: 06/12/2011 [facility] Patient Information 2015 [place], [place]. This information is not intended to replace advice given to you by your health care  provider. Make sure you discuss any questions you have with your health care provider.

## 2014-08-17 ENCOUNTER — Encounter (HOSPITAL_BASED_OUTPATIENT_CLINIC_OR_DEPARTMENT_OTHER): Payer: Self-pay | Admitting: Emergency Medicine

## 2014-08-17 ENCOUNTER — Emergency Department (HOSPITAL_BASED_OUTPATIENT_CLINIC_OR_DEPARTMENT_OTHER)
Admission: EM | Admit: 2014-08-17 | Discharge: 2014-08-17 | Disposition: A | Discharge status: Home / Self Care | Payer: BC Managed Care – PPO | Attending: Emergency Medicine | Admitting: Emergency Medicine

## 2014-08-17 ENCOUNTER — Emergency Department (HOSPITAL_BASED_OUTPATIENT_CLINIC_OR_DEPARTMENT_OTHER): Payer: BC Managed Care – PPO

## 2014-08-17 DIAGNOSIS — Z880 Allergy status to penicillin: Secondary | ICD-10-CM | POA: Diagnosis not present

## 2014-08-17 DIAGNOSIS — N200 Calculus of kidney: Secondary | ICD-10-CM

## 2014-08-17 DIAGNOSIS — Z79899 Other long term (current) drug therapy: Secondary | ICD-10-CM | POA: Diagnosis not present

## 2014-08-17 DIAGNOSIS — Z720 Tobacco use: Secondary | ICD-10-CM | POA: Diagnosis not present

## 2014-08-17 DIAGNOSIS — R109 Unspecified abdominal pain: Secondary | ICD-10-CM | POA: Diagnosis present

## 2014-08-17 DIAGNOSIS — Z792 Long term (current) use of antibiotics: Secondary | ICD-10-CM | POA: Diagnosis not present

## 2014-08-17 DIAGNOSIS — J45909 Unspecified asthma, uncomplicated: Secondary | ICD-10-CM | POA: Insufficient documentation

## 2014-08-17 DIAGNOSIS — Z7982 Long term (current) use of aspirin: Secondary | ICD-10-CM | POA: Insufficient documentation

## 2014-08-17 LAB — URINE MICROSCOPIC-ADD ON

## 2014-08-17 LAB — URINALYSIS, ROUTINE W REFLEX MICROSCOPIC
Bilirubin Urine: NEGATIVE
GLUCOSE, UA: NEGATIVE mg/dL
KETONES UR: NEGATIVE mg/dL
LEUKOCYTES UA: NEGATIVE
NITRITE: NEGATIVE
PH: 5.5 (ref 5.0–8.0)
Protein, ur: NEGATIVE mg/dL
SPECIFIC GRAVITY, URINE: 1.017 (ref 1.005–1.030)
Urobilinogen, UA: 0.2 mg/dL (ref 0.0–1.0)

## 2014-08-17 MED ORDER — KETOROLAC TROMETHAMINE 60 MG/2ML IM SOLN
60.0000 mg | Freq: Once | INTRAMUSCULAR | Status: AC
  Administered 2014-08-17: 60 mg via INTRAMUSCULAR
  Filled 2014-08-17: qty 2

## 2014-08-17 MED ORDER — IBUPROFEN 800 MG PO TABS: 800.0000 mg | ORAL_TABLET | Freq: Three times a day (TID) | ORAL | Status: DC

## 2014-08-17 MED ORDER — ONDANSETRON 8 MG PO TBDP
8.0000 mg | ORAL_TABLET | Freq: Once | ORAL | Status: AC
  Administered 2014-08-17: 8 mg via ORAL
  Filled 2014-08-17: qty 1

## 2014-08-17 MED ORDER — OXYCODONE-ACETAMINOPHEN 5-325 MG PO TABS
2.0000 | ORAL_TABLET | Freq: Once | ORAL | Status: AC
  Administered 2014-08-17: 2 via ORAL
  Filled 2014-08-17: qty 2

## 2014-08-17 MED ORDER — OXYCODONE-ACETAMINOPHEN 5-325 MG PO TABS: 1.0000 | ORAL_TABLET | Freq: Four times a day (QID) | ORAL | Status: DC | PRN

## 2014-08-17 MED ORDER — DICYCLOMINE HCL 10 MG PO CAPS
10.0000 mg | ORAL_CAPSULE | Freq: Once | ORAL | Status: AC
  Administered 2014-08-17: 10 mg via ORAL
  Filled 2014-08-17: qty 1

## 2014-08-17 NOTE — ED Provider Notes (Signed)
CSN: [id number]     Arrival date & time 08/17/14  0244 History   First MD Initiated Contact with Patient 08/17/14 [phone]     Chief Complaint  Patient presents with  . Flank Pain     (Consider location/radiation/quality/duration/timing/severity/associated sxs/prior Treatment) Patient is a 43 y.o. male presenting with groin pain.  Groin Pain This is a recurrent problem. The current episode started 3 to 5 hours ago. The problem occurs constantly. The problem has not changed since onset.Pertinent negatives include no chest pain, no headaches and no shortness of breath. Nothing aggravates the symptoms. Nothing relieves the symptoms. He has tried nothing for the symptoms. The treatment provided no relief.  Some dysuria it feels like previous kidney stone also have diarrhea  Past Medical History  Diagnosis Date  . Asthma     in childhood   Past Surgical History  Procedure Laterality Date  . Incision and drainage Left 02/02/2013    Procedure: INCISION AND dedribment;  Surgeon: Dominica SeverinWilliam Gramig, MD;  Location: [facility];  Service: Orthopedics;  Laterality: Left;  . Open reduction internal fixation (orif) metacarpal Left 02/02/2013    Procedure: OPEN REDUCTION INTERNAL FIXATION (ORIF) proximal thumb;  Surgeon: Dominica SeverinWilliam Gramig, MD;  Location: [facility];  Service: Orthopedics;  Laterality: Left;  . Nerve, tendon and artery repair Left 02/02/2013    Procedure: Extensor Pollicis Longus TENDON repair;  Surgeon: Dominica SeverinWilliam Gramig, MD;  Location: [facility];  Service: Orthopedics;  Laterality: Left;   History reviewed. No pertinent family history. History  Substance Use Topics  . Smoking status: Current Every Day Smoker -- 0.50 packs/day for 15 years    Types: Cigars  . Smokeless tobacco: Never Used  . Alcohol Use: No    Review of Systems  Respiratory: Negative for shortness of breath.   Cardiovascular: Negative for chest pain.  Neurological: Negative for headaches.  All other systems reviewed and are  negative.     Allergies  Penicillins  Home Medications   Prior to Admission medications   Medication Sig Start Date End Date Taking? Authorizing Provider  ALPRAZolam Prudy Feeler(XANAX) 1 MG tablet Take 1 mg by mouth at bedtime as needed for sleep.    Historical Provider, MD  aspirin 325 MG tablet Take 1 tablet (325 mg total) by mouth 2 (two) times daily. 02/04/13   Sheran LawlessBrian L Buchanan, PA-C  oxyCODONE (OXY IR/ROXICODONE) 5 MG immediate release tablet Take 1-2 tablets (5-10 mg total) by mouth every 3 (three) hours as needed. 02/04/13   Sheran LawlessBrian L Buchanan, PA-C  promethazine (PHENERGAN) 25 MG tablet Take 1 tablet (25 mg total) by mouth every 6 (six) hours as needed for nausea. 05/30/14   Geoffery Lyonsouglas Delo, MD  sulfamethoxazole-trimethoprim (BACTRIM DS) 800-160 MG per tablet Take 1 tablet by mouth 2 (two) times daily. 02/04/13   Sheran LawlessBrian L Buchanan, PA-C   BP 120/80 mmHg  Pulse 66  Temp(Src) 98.4 F (36.9 C) (Oral)  Resp 16  Ht 5\' 7"  (1.702 m)  Wt 147 lb (66.679 kg)  BMI 23.02 kg/m2  SpO2 100% Physical Exam  Constitutional: He is oriented to person, place, and time. He appears well-developed and well-nourished. No distress.  HENT:  Head: Normocephalic and atraumatic.  Mouth/Throat: Oropharynx is clear and moist.  Eyes: Conjunctivae are normal. Pupils are equal, round, and reactive to light.  Neck: Normal range of motion. Neck supple.  Cardiovascular: Normal rate and regular rhythm.   Pulmonary/Chest: Effort normal and breath sounds normal. He has no wheezes. He has no rales.  Abdominal: Soft. Bowel sounds are normal. There is no tenderness. There is no rebound and no guarding.  Musculoskeletal: Normal range of motion.  Neurological: He is alert and oriented to person, place, and time.  Skin: Skin is warm and dry.    ED Course  Procedures (including critical care time) Labs Review Labs Reviewed  URINALYSIS, ROUTINE W REFLEX MICROSCOPIC - Abnormal; Notable for the following:    Hgb urine dipstick SMALL  (*)    All other components within normal limits  URINE MICROSCOPIC-ADD ON - Abnormal; Notable for the following:    Bacteria, UA FEW (*)    Casts HYALINE CASTS (*)    All other components within normal limits    Imaging Review Ct Renal Stone Study  08/17/2014   CLINICAL DATA:  RIGHT flank pain, hematuria. Assess for renal stone.  EXAM: CT ABDOMEN AND PELVIS WITHOUT CONTRAST  TECHNIQUE: Multidetector CT imaging of the abdomen and pelvis was performed following the standard protocol without IV contrast.  COMPARISON:  CT of the abdomen and pelvis May 25, 2013  FINDINGS: LUNG BASES: 4 mm RIGHT middle lobe sub solid pulmonary nodule, below size surveillance recommendations. Bilateral lower lobe 3-4 mm subpleural pulmonary nodules are likely benign. The visualized heart and pericardium are unremarkable.  KIDNEYS/BLADDER: Kidneys are orthotopic, demonstrating normal size and morphology. Mild RIGHT hydroureteronephrosis. No nephrolithiasis; limited assessment for renal masses on this nonenhanced examination. Urinary bladder is well distended, containing a 3 mm calculus.  SOLID ORGANS: The liver, spleen, gallbladder, pancreas and adrenal glands are unremarkable for this non-contrast examination.  GASTROINTESTINAL TRACT: The stomach, small and large bowel are normal in course and caliber without inflammatory changes, the sensitivity may be decreased by lack of enteric contrast. Normal appendix.  PERITONEUM/RETROPERITONEUM: No intraperitoneal free fluid nor free air. Aortoiliac vessels are normal in course and caliber. No lymphadenopathy by CT size criteria. Internal reproductive organs are unremarkable.  SOFT TISSUES/ OSSEOUS STRUCTURES: Nonsuspicious. Bridging bilateral sacroiliac osteophytes.  IMPRESSION: Mild RIGHT hydroureteronephrosis, 3 mm bladder calculus likely reflecting recently passed stone, no residual nephrolithiasis.   Electronically Signed   By: Awilda Metroourtnay  Bloomer   On: 08/17/2014 04:20      EKG Interpretation None      MDM   Final diagnoses:  Flank pain    Will treat as kidney stone with pain medication.  Will provide strainer and refer to urology.      Jasmine AweApril K Amarius Toto-Rasch, MD 08/17/14 [phone]

## 2014-08-17 NOTE — ED Notes (Signed)
Pt reports right sided flank pain with abrupt onset this am, painful urination

## 2015-11-22 ENCOUNTER — Emergency Department (HOSPITAL_BASED_OUTPATIENT_CLINIC_OR_DEPARTMENT_OTHER)
Admission: EM | Admit: 2015-11-22 | Discharge: 2015-11-22 | Disposition: A | Discharge status: Home / Self Care | Payer: BLUE CROSS/BLUE SHIELD | Attending: Emergency Medicine | Admitting: Emergency Medicine

## 2015-11-22 ENCOUNTER — Emergency Department (HOSPITAL_BASED_OUTPATIENT_CLINIC_OR_DEPARTMENT_OTHER): Payer: BLUE CROSS/BLUE SHIELD

## 2015-11-22 ENCOUNTER — Encounter (HOSPITAL_BASED_OUTPATIENT_CLINIC_OR_DEPARTMENT_OTHER): Payer: Self-pay | Admitting: Emergency Medicine

## 2015-11-22 DIAGNOSIS — F17210 Nicotine dependence, cigarettes, uncomplicated: Secondary | ICD-10-CM | POA: Insufficient documentation

## 2015-11-22 DIAGNOSIS — Z880 Allergy status to penicillin: Secondary | ICD-10-CM | POA: Insufficient documentation

## 2015-11-22 DIAGNOSIS — Z7982 Long term (current) use of aspirin: Secondary | ICD-10-CM | POA: Insufficient documentation

## 2015-11-22 DIAGNOSIS — R110 Nausea: Secondary | ICD-10-CM | POA: Insufficient documentation

## 2015-11-22 DIAGNOSIS — Z87442 Personal history of urinary calculi: Secondary | ICD-10-CM | POA: Insufficient documentation

## 2015-11-22 DIAGNOSIS — Z792 Long term (current) use of antibiotics: Secondary | ICD-10-CM | POA: Diagnosis not present

## 2015-11-22 DIAGNOSIS — R109 Unspecified abdominal pain: Secondary | ICD-10-CM | POA: Diagnosis not present

## 2015-11-22 DIAGNOSIS — J45909 Unspecified asthma, uncomplicated: Secondary | ICD-10-CM | POA: Insufficient documentation

## 2015-11-22 DIAGNOSIS — Z79899 Other long term (current) drug therapy: Secondary | ICD-10-CM | POA: Diagnosis not present

## 2015-11-22 HISTORY — DX: Calculus of kidney: N20.0

## 2015-11-22 LAB — URINALYSIS, ROUTINE W REFLEX MICROSCOPIC
BILIRUBIN URINE: NEGATIVE
Glucose, UA: NEGATIVE mg/dL
Hgb urine dipstick: NEGATIVE
Ketones, ur: NEGATIVE mg/dL
Leukocytes, UA: NEGATIVE
NITRITE: NEGATIVE
Protein, ur: NEGATIVE mg/dL
SPECIFIC GRAVITY, URINE: 1.022 (ref 1.005–1.030)
pH: 5.5 (ref 5.0–8.0)

## 2015-11-22 MED ORDER — ONDANSETRON 8 MG PO TBDP: 8.0000 mg | ORAL_TABLET | Freq: Three times a day (TID) | ORAL | Status: AC | PRN

## 2015-11-22 MED ORDER — IBUPROFEN 600 MG PO TABS: 600.0000 mg | ORAL_TABLET | Freq: Three times a day (TID) | ORAL | Status: AC | PRN

## 2015-11-22 MED ORDER — ONDANSETRON HCL 4 MG/2ML IJ SOLN
4.0000 mg | Freq: Once | INTRAMUSCULAR | Status: AC
  Administered 2015-11-22: 4 mg via INTRAVENOUS

## 2015-11-22 MED ORDER — KETOROLAC TROMETHAMINE 30 MG/ML IJ SOLN
30.0000 mg | Freq: Once | INTRAMUSCULAR | Status: AC
  Administered 2015-11-22: 30 mg via INTRAVENOUS

## 2015-11-22 MED ORDER — HYDROMORPHONE HCL 1 MG/ML IJ SOLN
1.0000 mg | Freq: Once | INTRAMUSCULAR | Status: AC
  Administered 2015-11-22: 1 mg via INTRAVENOUS

## 2015-11-22 MED ORDER — CYCLOBENZAPRINE HCL 10 MG PO TABS: 10.0000 mg | ORAL_TABLET | Freq: Three times a day (TID) | ORAL | Status: AC | PRN

## 2015-11-22 NOTE — ED Provider Notes (Signed)
CSN: [id number]     Arrival date & time 11/22/15  0700 History   First MD Initiated Contact with Patient 11/22/15 [phone]     Chief Complaint  Patient presents with  . Flank Pain      The history is provided by the patient and medical records.  Pt is a 45 yo male with a hx of kidney stones presenting with acute onset right flank pain that began at 530 am. Reports nausea without vomiting. Reports no radiation of his pain and denies dysuria, but admits to sense of urinary urge.  Reports this feels similar to his prior kidney stones.  Denies abdominal pain.  No fevers or chills.  Pain is moderate to severe in severity.  Nothing improves or worsens his pain.  Denies recent injury or trauma.  Denies lower extremity symptoms    Past Medical History  Diagnosis Date  . Asthma     in childhood  . Kidney stone    Past Surgical History  Procedure Laterality Date  . Incision and drainage Left 02/02/2013    Procedure: INCISION AND dedribment;  Surgeon: Dominica Severin, MD;  Location: [facility];  Service: Orthopedics;  Laterality: Left;  . Open reduction internal fixation (orif) metacarpal Left 02/02/2013    Procedure: OPEN REDUCTION INTERNAL FIXATION (ORIF) proximal thumb;  Surgeon: Dominica Severin, MD;  Location: [facility];  Service: Orthopedics;  Laterality: Left;  . Nerve, tendon and artery repair Left 02/02/2013    Procedure: Extensor Pollicis Longus TENDON repair;  Surgeon: Dominica Severin, MD;  Location: [facility];  Service: Orthopedics;  Laterality: Left;   No family history on file. Social History  Substance Use Topics  . Smoking status: Current Every Day Smoker -- 0.50 packs/day for 15 years    Types: Cigars  . Smokeless tobacco: Never Used  . Alcohol Use: No    Review of Systems  All other systems reviewed and are negative.     Allergies  Penicillins  Home Medications   Prior to Admission medications   Medication Sig Start Date End Date Taking? Authorizing Provider  ALPRAZolam Prudy Feeler) 1  MG tablet Take 1 mg by mouth at bedtime as needed for sleep.    Historical Provider, MD  aspirin 325 MG tablet Take 1 tablet (325 mg total) by mouth 2 (two) times daily. 02/04/13   Karie Chimera, PA-C  ibuprofen (ADVIL,MOTRIN) 800 MG tablet Take 1 tablet (800 mg total) by mouth 3 (three) times daily. 08/17/14   April Palumbo, MD  oxyCODONE (OXY IR/ROXICODONE) 5 MG immediate release tablet Take 1-2 tablets (5-10 mg total) by mouth every 3 (three) hours as needed. 02/04/13   Karie Chimera, PA-C  oxyCODONE-acetaminophen (PERCOCET) 5-325 MG per tablet Take 1-2 tablets by mouth every 6 (six) hours as needed. 08/17/14   April Palumbo, MD  promethazine (PHENERGAN) 25 MG tablet Take 1 tablet (25 mg total) by mouth every 6 (six) hours as needed for nausea. 05/30/14   Geoffery Lyons, MD  sulfamethoxazole-trimethoprim (BACTRIM DS) 800-160 MG per tablet Take 1 tablet by mouth 2 (two) times daily. 02/04/13   Karie Chimera, PA-C   BP 124/87 mmHg  Pulse 64  Temp(Src) 97.9 F (36.6 C) (Oral)  Resp 18  Ht  (1.702 m)  Wt 154 lb (69.854 kg)  BMI 24.11 kg/m2  SpO2 98% Physical Exam  Constitutional: He is oriented to person, place, and time. He appears well-developed and well-nourished.  Uncomfortable. Pacing around the room  HENT:  Head: Normocephalic and atraumatic.  Eyes: EOM are normal.  Neck: Normal range of motion.  Cardiovascular: Normal rate.   Pulmonary/Chest: Effort normal.  Abdominal: Soft. There is no tenderness.  Musculoskeletal: Normal range of motion.  No thoracic or lumbar tenderness  Neurological: He is alert and oriented to person, place, and time.  Skin: Skin is warm and dry.  Psychiatric: He has a normal mood and affect. Judgment normal.  Nursing note and vitals reviewed.   ED Course  Procedures (including critical care time) Labs Review Labs Reviewed  URINALYSIS, ROUTINE W REFLEX [facility]COPIC ([facility]) - Abnormal; Notable for the following:    APPearance CLOUDY (*)    All  other components within normal limits    Imaging Review Ct Renal Stone Study  11/22/2015  CLINICAL DATA:  45 year old male with a history of right flank pain since 5:30 a.m. EXAM: CT ABDOMEN AND PELVIS WITHOUT CONTRAST TECHNIQUE: Multidetector CT imaging of the abdomen and pelvis was performed following the standard protocol without IV contrast. COMPARISON:  08/17/2014 FINDINGS: Lower chest: Unremarkable appearance of the soft tissues of the chest wall. Heart size within normal limits.  No pericardial fluid/thickening. No lower mediastinal adenopathy. Unremarkable appearance of the distal esophagus. No hiatal hernia. No confluent airspace disease, pleural fluid, or pneumothorax within visualized lung. There are a few peripheral nodules in the subpleural location of the right lower lobe and left lower lobe, each of these is unchanged from the comparison CT, and overall appear less prominent. Abdomen/pelvis: Unremarkable appearance of liver and spleen. Unremarkable appearance of bilateral adrenal glands. No peripancreatic or pericholecystic fluid or inflammatory changes. No radio-opaque gallstones. No intrahepatic or extrahepatic biliary ductal dilatation. No intra-peritoneal free air or significant free-fluid. No abnormally dilated small bowel or colon. No transition point. No inflammatory changes of the mesenteries. Normal appendix identified. 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 Right Kidney/Ureter: There is a tiny nonobstructive stone in the posterior calyx of the right kidney. No hydronephrosis. No perinephric stranding. Unremarkable course of the right ureter. Left Kidney/Ureter: No hydronephrosis. No nephrolithiasis. No perinephric stranding. Unremarkable course of the left ureter. Unremarkable appearance of the urinary bladder, with resolution of prior stone. No significant vascular calcification. No aneurysm or periaortic fluid identified. Musculoskeletal: No  displaced fracture identified. No significant degenerative changes of the spine. IMPRESSION: Tiny nonobstructive stone of the right collecting system, without evidence of hydronephrosis. Unremarkable left kidney and left ureter. Compared to the prior CT, the bladder stone has resolved. Signed, Yvone NeuJaime S. Loreta AveWagner, DO [facility] Electronically Signed   By: Gilmer MorJaime  Wagner D.O.   On: 11/22/2015 07:57   I have personally reviewed and evaluated these images and lab results as part of my medical decision-making.   EKG Interpretation None      MDM   Final diagnoses:  Right flank pain    8:12 AM The patient is feeling much better at this time.  CT scan demonstrates nonobstructing right renal stone.  No evidence of ureteral stone or hydronephrosis.  Pain may have represented recently passed right ureteral stone with ongoing ureteral colic.  Patient feels better at this time.  Urine is without signs of infection.  Discharge home in good condition.  Primary care follow-up.  He understands to return to the emergency department for new or worsening symptoms.  Medications  HYDROmorphone (DILAUDID) injection 1 mg (1 mg Intravenous Given 11/22/15 0738)  ketorolac (TORADOL) 30 MG/ML injection 30 mg (30 mg Intravenous Given 11/22/15 0738)  ondansetron (  ZOFRAN) injection 4 mg (4 mg Intravenous Given 11/22/15 0737)       Azalia Bilis, MD 11/22/15 [phone]

## 2015-11-22 NOTE — ED Notes (Signed)
Pt c/o right flank pain. Pt has hx of kidney stones.

## 2015-11-22 NOTE — Discharge Instructions (Signed)
Flank Pain °Flank pain refers to pain that is located on the side of the body between the upper abdomen and the back. The pain may occur over a short period of time (acute) or may be long-term or reoccurring (chronic). It may be mild or severe. Flank pain can be caused by many things. °CAUSES  °Some of the more common causes of flank pain include: °· Muscle strains.   °· Muscle spasms.   °· A disease of your spine (vertebral disk disease).   °· A lung infection (pneumonia).   °· Fluid around your lungs (pulmonary edema).   °· A kidney infection.   °· Kidney stones.   °· A very painful skin rash caused by the chickenpox virus (shingles).   °· Gallbladder disease.   °HOME CARE INSTRUCTIONS  °Home care will depend on the cause of your pain. In general, °· Rest as directed by your caregiver. °· Drink enough fluids to keep your urine clear or pale yellow. °· Only take over-the-counter or prescription medicines as directed by your caregiver. Some medicines may help relieve the pain. °· Tell your caregiver about any changes in your pain. °· Follow up with your caregiver as directed. °SEEK IMMEDIATE MEDICAL CARE IF:  °· Your pain is not controlled with medicine.   °· You have new or worsening symptoms. °· Your pain increases.   °· You have abdominal pain.   °· You have shortness of breath.   °· You have persistent nausea or vomiting.   °· You have swelling in your abdomen.   °· You feel faint or pass out.   °· You have blood in your urine. °· You have a fever or persistent symptoms for more than 2-3 days. °· You have a fever and your symptoms suddenly get worse. °MAKE SURE YOU:  °· Understand these instructions. °· Will watch your condition. °· Will get help right away if you are not doing well or get worse. °  °This information is not intended to replace advice given to you by your health care provider. Make sure you discuss any questions you have with your health care provider. °  °Document Released: 10/17/2005 Document  Revised: 05/20/2012 Document Reviewed: 04/09/2012 °Elsevier Interactive Patient Education ©2016 [employer] ° °

## 2016-12-24 ENCOUNTER — Emergency Department (HOSPITAL_BASED_OUTPATIENT_CLINIC_OR_DEPARTMENT_OTHER)
Admission: EM | Admit: 2016-12-24 | Discharge: 2016-12-24 | Disposition: A | Discharge status: Home / Self Care | Payer: BLUE CROSS/BLUE SHIELD | Attending: Emergency Medicine | Admitting: Emergency Medicine

## 2016-12-24 ENCOUNTER — Encounter (HOSPITAL_BASED_OUTPATIENT_CLINIC_OR_DEPARTMENT_OTHER): Payer: Self-pay | Admitting: Emergency Medicine

## 2016-12-24 DIAGNOSIS — J45909 Unspecified asthma, uncomplicated: Secondary | ICD-10-CM | POA: Insufficient documentation

## 2016-12-24 DIAGNOSIS — M5417 Radiculopathy, lumbosacral region: Secondary | ICD-10-CM | POA: Diagnosis not present

## 2016-12-24 DIAGNOSIS — X500XXA Overexertion from strenuous movement or load, initial encounter: Secondary | ICD-10-CM | POA: Diagnosis not present

## 2016-12-24 DIAGNOSIS — Y9389 Activity, other specified: Secondary | ICD-10-CM | POA: Insufficient documentation

## 2016-12-24 DIAGNOSIS — Y929 Unspecified place or not applicable: Secondary | ICD-10-CM | POA: Diagnosis not present

## 2016-12-24 DIAGNOSIS — Y990 Civilian activity done for income or pay: Secondary | ICD-10-CM | POA: Insufficient documentation

## 2016-12-24 DIAGNOSIS — F17290 Nicotine dependence, other tobacco product, uncomplicated: Secondary | ICD-10-CM | POA: Diagnosis not present

## 2016-12-24 DIAGNOSIS — S3992XA Unspecified injury of lower back, initial encounter: Secondary | ICD-10-CM | POA: Diagnosis present

## 2016-12-24 DIAGNOSIS — Z79899 Other long term (current) drug therapy: Secondary | ICD-10-CM | POA: Insufficient documentation

## 2016-12-24 MED ORDER — KETOROLAC TROMETHAMINE 60 MG/2ML IM SOLN
30.0000 mg | Freq: Once | INTRAMUSCULAR | Status: AC
  Administered 2016-12-24: 30 mg via INTRAMUSCULAR
  Filled 2016-12-24: qty 2

## 2016-12-24 NOTE — ED Provider Notes (Signed)
[facility] Note   CSN: [id number] Arrival date & time: 12/24/16  4098     History   Chief Complaint Chief Complaint  Patient presents with  . Back Pain    HPI Jimmy Mcbride is a 46 y.o. male.  The history is provided by the patient.  Back Pain   This is a new problem. The current episode started more than 2 days ago. The problem occurs daily. The problem has not changed since onset.The pain is associated with no known injury. The pain is present in the lumbar spine. The quality of the pain is described as shooting. The pain radiates to the left thigh. The pain is moderate. The symptoms are aggravated by certain positions. Pertinent negatives include no fever, no numbness, no abdominal pain, no bowel incontinence, no bladder incontinence, no paresthesias and no weakness. He has tried NSAIDs for the symptoms. The treatment provided mild relief.  patient presents with low back pain that radiates into left LE No trauma No focal leg weakness No urinary symptoms to suggest kidney stone No h/o back surgery He does heavy lifting at work No h/o cancer He has never had this pain before  Past Medical History:  Diagnosis Date  . Asthma    in childhood  . Kidney stone     There are no active problems to display for this patient.   Past Surgical History:  Procedure Laterality Date  . INCISION AND DRAINAGE Left 02/02/2013   Procedure: INCISION AND dedribment;  Surgeon: Dominica Severin, MD;  Location: [facility];  Service: Orthopedics;  Laterality: Left;  . NERVE, TENDON AND ARTERY REPAIR Left 02/02/2013   Procedure: Extensor Pollicis Longus TENDON repair;  Surgeon: Dominica Severin, MD;  Location: [facility];  Service: Orthopedics;  Laterality: Left;  . OPEN REDUCTION INTERNAL FIXATION (ORIF) METACARPAL Left 02/02/2013   Procedure: OPEN REDUCTION INTERNAL FIXATION (ORIF) proximal thumb;  Surgeon: Dominica Severin, MD;  Location: [facility];  Service: Orthopedics;  Laterality:  Left;       Home Medications    Prior to Admission medications   Medication Sig Start Date End Date Taking? Authorizing Provider  ALPRAZolam Prudy Feeler) 1 MG tablet Take 1 mg by mouth at bedtime as needed for sleep.    Historical Provider, MD  cyclobenzaprine (FLEXERIL) 10 MG tablet Take 1 tablet (10 mg total) by mouth 3 (three) times daily as needed for muscle spasms. 11/22/15   Azalia Bilis, MD  ibuprofen (ADVIL,MOTRIN) 600 MG tablet Take 1 tablet (600 mg total) by mouth every 8 (eight) hours as needed. 11/22/15   Azalia Bilis, MD  ondansetron (ZOFRAN ODT) 8 MG disintegrating tablet Take 1 tablet (8 mg total) by mouth every 8 (eight) hours as needed for nausea or vomiting. 11/22/15   Azalia Bilis, MD    Family History History reviewed. No pertinent family history.  Social History Social History  Substance Use Topics  . Smoking status: Current Every Day Smoker    Packs/day: 0.50    Years: 15.00    Types: Cigars  . Smokeless tobacco: Never Used  . Alcohol use No     Allergies   Penicillins   Review of Systems Review of Systems  Constitutional: Negative for fever.  Gastrointestinal: Negative for abdominal pain and bowel incontinence.  Genitourinary: Negative for bladder incontinence and difficulty urinating.  Musculoskeletal: Positive for back pain.  Neurological: Negative for weakness, numbness and paresthesias.  All other systems reviewed and are negative.    Physical Exam Updated Vital  Signs BP 115/75 (BP Location: Right Arm)   Pulse (!) 56   Temp 98.1 F (36.7 C) (Oral)   Resp 18   Ht  (1.702 m)   Wt 67.1 kg   SpO2 100%   BMI 23.18 kg/m   Physical Exam CONSTITUTIONAL: Well developed/well nourished HEAD: Normocephalic/atraumatic EYES: EOMI ENMT: Mucous membranes moist NECK: supple no meningeal signs SPINE/BACK:diffuse lumbar spinal/paraspinal tenderness, No bruising/crepitance/stepoffs noted to spine CV: S1/S2 noted, no murmurs/rubs/gallops  noted LUNGS: Lungs are clear to auscultation bilaterally, no apparent distress ABDOMEN: soft, nontender GU:no cva tenderness NEURO: Awake/alert,  equal motor 5/5 strength noted with the following: hip flexion/knee flexion/extension, foot dorsi/plantar flexion,  no sensory deficit in any dermatome.  Equal patellar reflex noted (2+) in bilateral lower extremities.  Pt is able to ambulate unassisted. EXTREMITIES: pulses normal, full ROM SKIN: warm, color normal PSYCH: no abnormalities of mood noted, alert and oriented to situation    ED Treatments / Results  Labs (all labs ordered are listed, but only abnormal results are displayed) Labs Reviewed - No data to display  EKG  EKG Interpretation None       Radiology No results found.  Procedures Procedures (including critical care time)  Medications Ordered in ED Medications  ketorolac (TORADOL) injection 30 mg (not administered)     Initial Impression / Assessment and Plan / ED Course  I have reviewed the triage vital signs and the nursing notes.  Pt stable He is ambulatory Suspicious radiculopathy Given strict ER return precautions Continue OTC NSAIDS for one week   Final Clinical Impressions(s) / ED Diagnoses   Final diagnoses:  Lumbosacral radiculopathy    New Prescriptions New Prescriptions   No medications on file     [name], MD 12/24/16 1017

## 2016-12-24 NOTE — Discharge Instructions (Signed)

## 2016-12-24 NOTE — ED Triage Notes (Signed)
Pt c/o middle low back pain since Fri; no injury; sts pain radiates down LLE

## 2017-09-11 IMAGING — CT CT RENAL STONE PROTOCOL
2 of 4 series · 15 of 46 positions shown, 17 images · non-contrast
Comparison: 08/17/2014

CLINICAL DATA: 45-year-old male with a history of right flank pain
since [DATE] a.m..

EXAM:
CT ABDOMEN AND PELVIS WITHOUT CONTRAST
TECHNIQUE: Multidetector CT imaging of the abdomen and pelvis was performed
following the standard protocol without IV contrast.

[Series 2: axial st · axial · 0.87mm/px · z∈[-656,-206]mm · 12 of 104 slices shown, 14 images]
[im 9/104  soft-tissue]
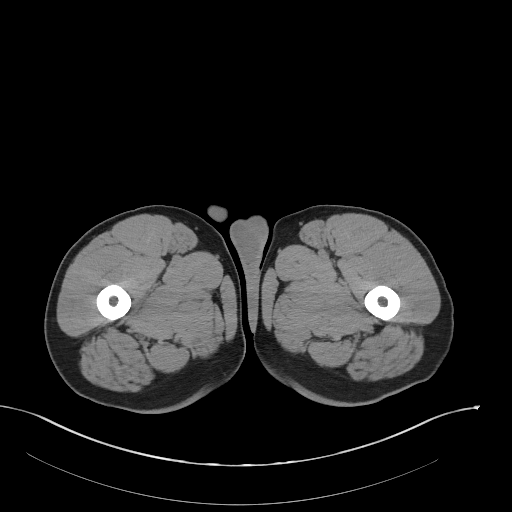
[im 9/104  bone]
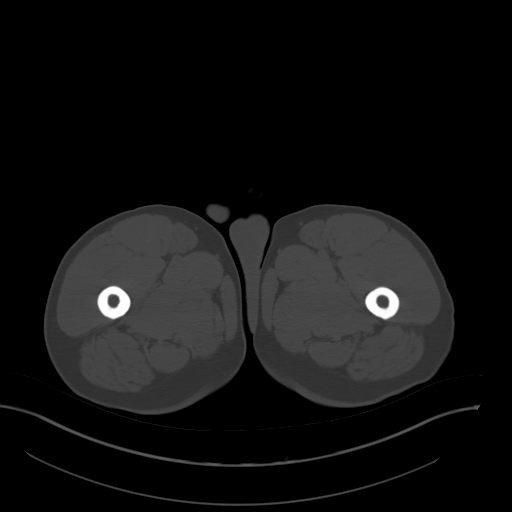
[im 17/104  soft-tissue]
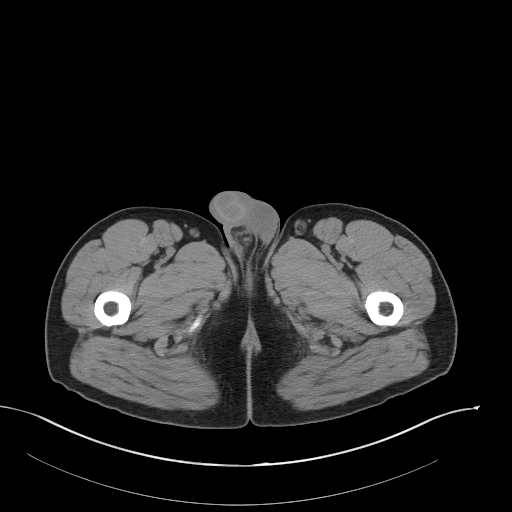
[im 25/104  soft-tissue]
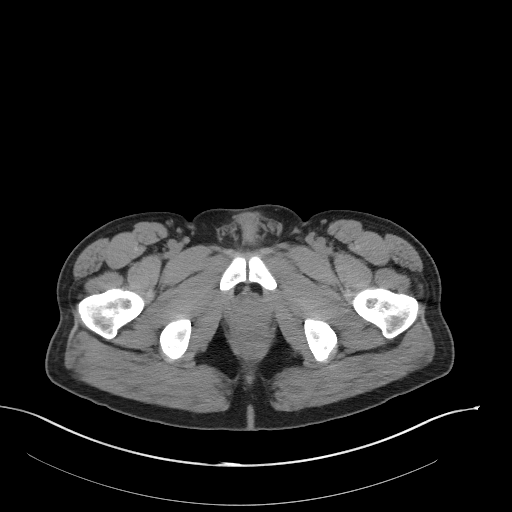
[im 33/104  soft-tissue]
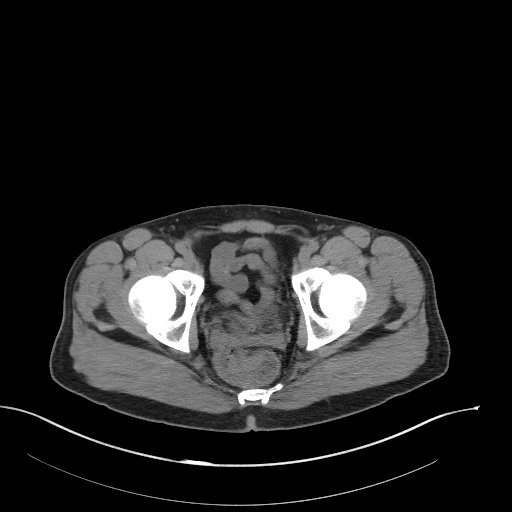
[im 42/104  soft-tissue]
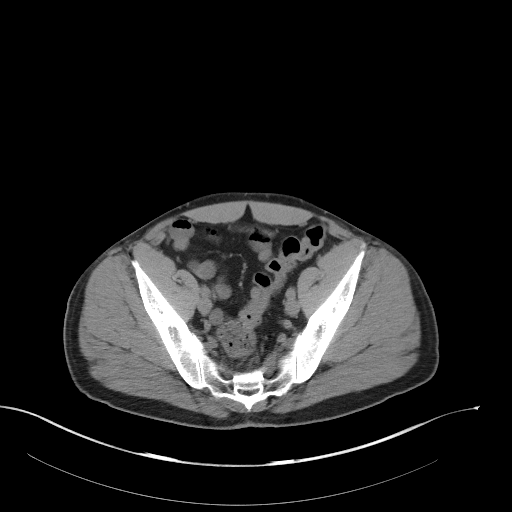
[im 50/104  soft-tissue]
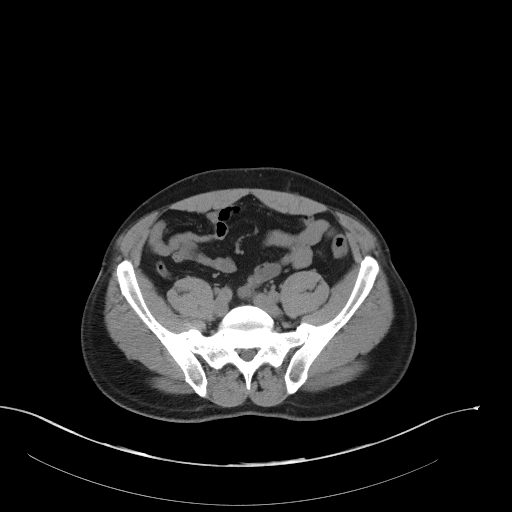
[im 58/104  soft-tissue]
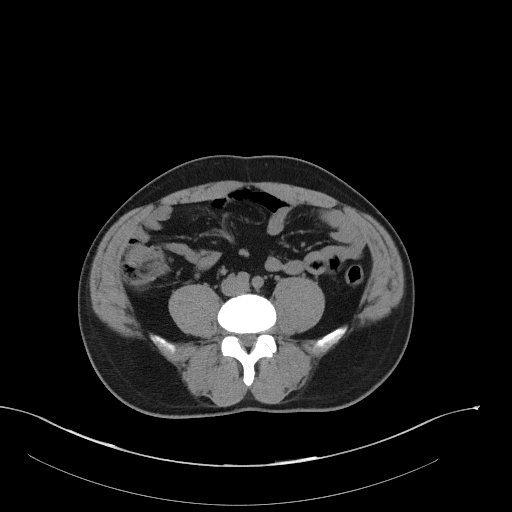
[im 66/104  soft-tissue]
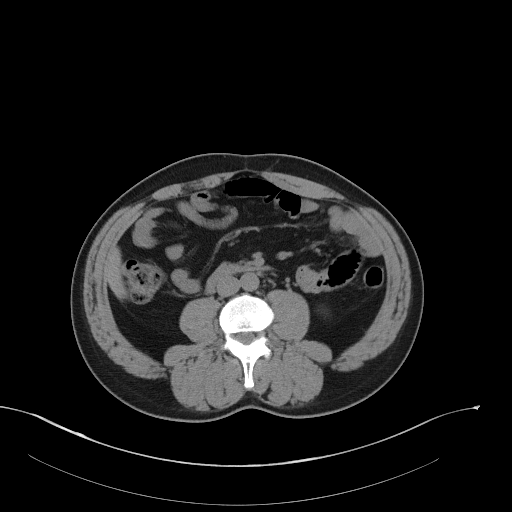
[im 75/104  soft-tissue]
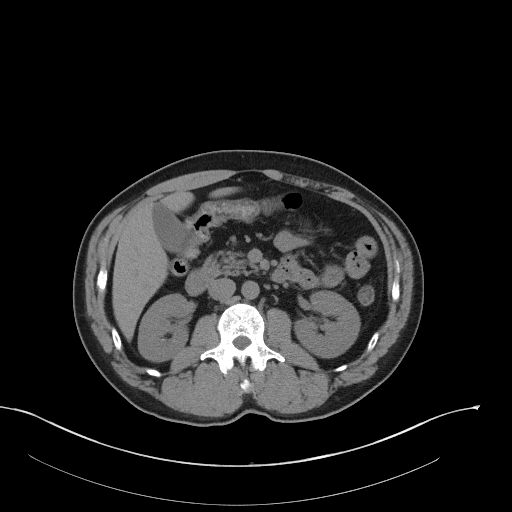
[im 75/104  bone]
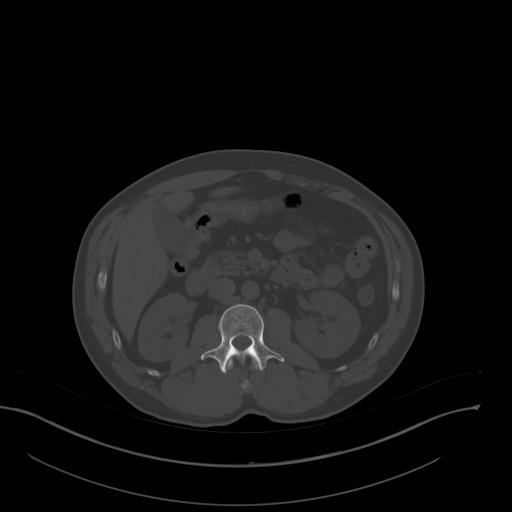
[im 83/104  soft-tissue]
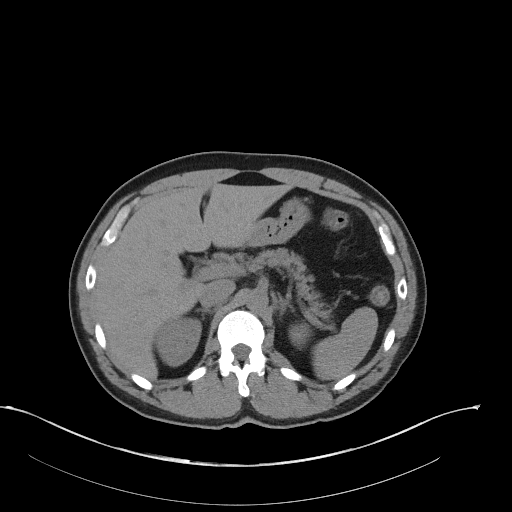
[im 91/104  soft-tissue]
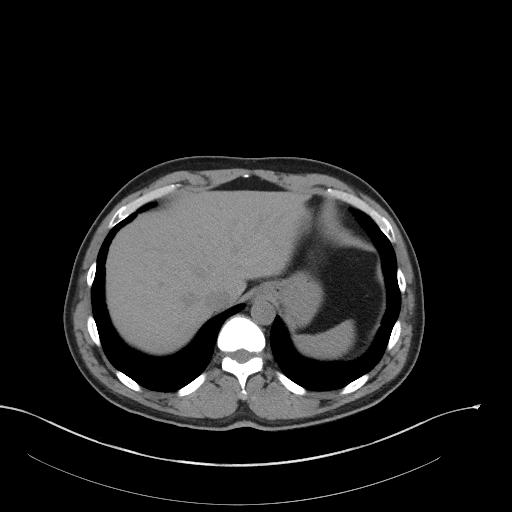
[im 99/104  soft-tissue]
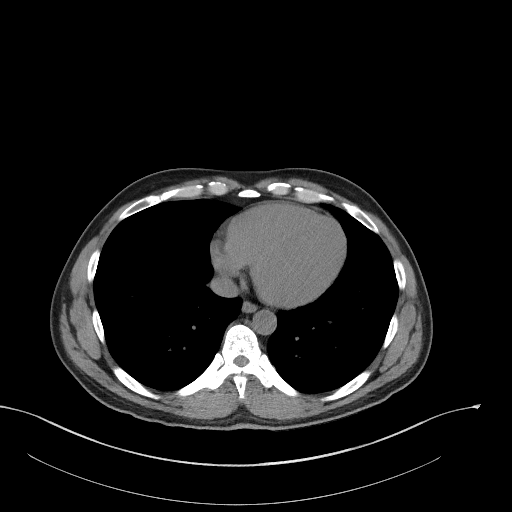

[Series 4: coronal st · coronal · 0.80mm/px · 3 of 89 slices shown]
[im 30/89  soft-tissue]
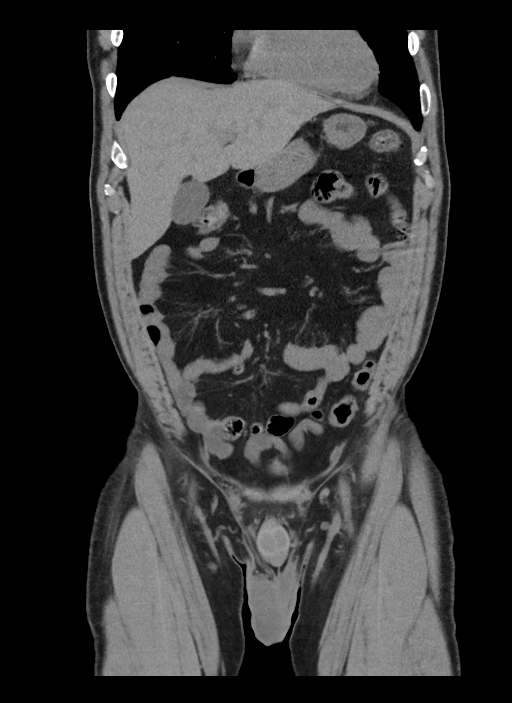
[im 40/89  soft-tissue]
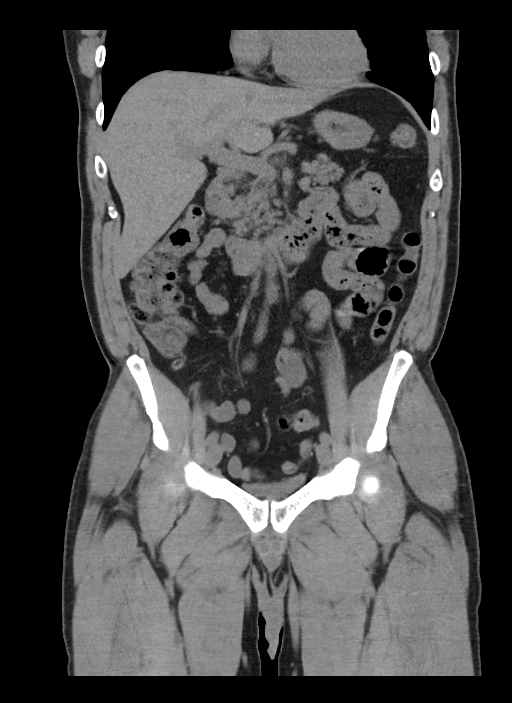
[im 49/89  soft-tissue]
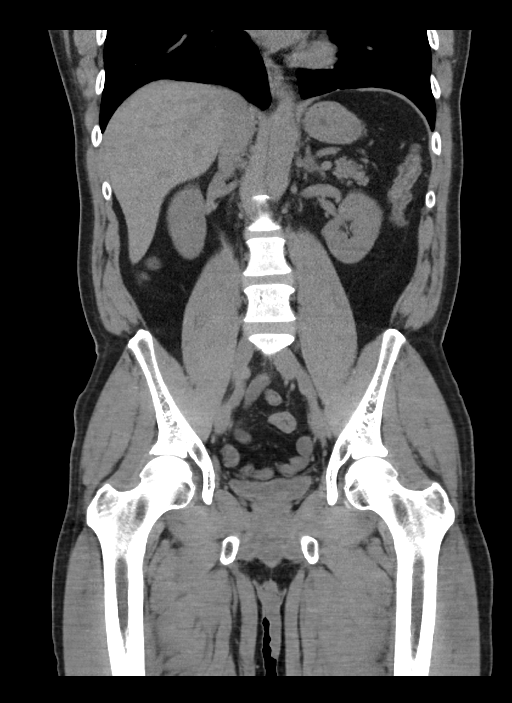

[15 of 46 positions shown; findings below may reference images not displayed]

FINDINGS: Lower chest:

Unremarkable appearance of the soft tissues of the chest wall.

Heart size within normal limits.  No pericardial fluid/thickening.

No lower mediastinal adenopathy.

Unremarkable appearance of the distal esophagus.

No hiatal hernia.

No confluent airspace disease, pleural fluid, or pneumothorax within
visualized lung.

There are a few peripheral nodules in the subpleural location of the
right lower lobe and left lower lobe, each of these is unchanged
from the comparison CT, and overall appear less prominent.

Abdomen/pelvis:

Unremarkable appearance of liver and spleen.

Unremarkable appearance of bilateral adrenal glands.

No peripancreatic or pericholecystic fluid or inflammatory changes.

No radio-opaque gallstones.

No intrahepatic or extrahepatic biliary ductal dilatation.

No intra-peritoneal free air or significant free-fluid.

No abnormally dilated small bowel or colon. No transition point. No
inflammatory changes of the mesenteries.

Normal appendix identified.

Diverticular disease of the colon without associated inflammatory
changes.

Right Kidney/Ureter:

There is a tiny nonobstructive stone in the posterior calyx of the
right kidney. No hydronephrosis. No perinephric stranding.
Unremarkable course of the right ureter.

Left Kidney/Ureter:

No hydronephrosis. No nephrolithiasis. No perinephric stranding.

Unremarkable course of the left ureter.

Unremarkable appearance of the urinary bladder, with resolution of
prior stone.

No significant vascular calcification. No aneurysm or periaortic
fluid identified.

Musculoskeletal:

No displaced fracture identified.

No significant degenerative changes of the spine.
IMPRESSION: Tiny nonobstructive stone of the right collecting system, without
evidence of hydronephrosis.

Unremarkable left kidney and left ureter.

Compared to the prior CT, the bladder stone has resolved.

## 2017-10-07 ENCOUNTER — Emergency Department (HOSPITAL_BASED_OUTPATIENT_CLINIC_OR_DEPARTMENT_OTHER): Payer: BLUE CROSS/BLUE SHIELD

## 2017-10-07 ENCOUNTER — Other Ambulatory Visit: Payer: Self-pay

## 2017-10-07 ENCOUNTER — Encounter (HOSPITAL_BASED_OUTPATIENT_CLINIC_OR_DEPARTMENT_OTHER): Payer: Self-pay | Admitting: *Deleted

## 2017-10-07 ENCOUNTER — Emergency Department (HOSPITAL_BASED_OUTPATIENT_CLINIC_OR_DEPARTMENT_OTHER)
Admission: EM | Admit: 2017-10-07 | Discharge: 2017-10-07 | Disposition: A | Discharge status: Home / Self Care | Payer: BLUE CROSS/BLUE SHIELD | Attending: Emergency Medicine | Admitting: Emergency Medicine

## 2017-10-07 DIAGNOSIS — F17290 Nicotine dependence, other tobacco product, uncomplicated: Secondary | ICD-10-CM | POA: Diagnosis not present

## 2017-10-07 DIAGNOSIS — J45909 Unspecified asthma, uncomplicated: Secondary | ICD-10-CM | POA: Insufficient documentation

## 2017-10-07 DIAGNOSIS — R079 Chest pain, unspecified: Secondary | ICD-10-CM | POA: Diagnosis present

## 2017-10-07 DIAGNOSIS — R0789 Other chest pain: Secondary | ICD-10-CM | POA: Diagnosis not present

## 2017-10-07 MED ORDER — NAPROXEN 375 MG PO TABS: 375.0000 mg | ORAL_TABLET | Freq: Two times a day (BID) | ORAL | 0 refills | Status: DC

## 2017-10-07 NOTE — ED Notes (Signed)
NAD at this time. Pt is stable and going home.  

## 2017-10-07 NOTE — ED Triage Notes (Signed)
Pt stated that he was having left sided rib pain that started yesterday afternoon.

## 2017-10-07 NOTE — ED Provider Notes (Signed)
[facility] EMERGENCY DEPARTMENT Provider Note   CSN: [id number] Arrival date & time: 10/07/17  1309     History   Chief Complaint Chief Complaint  Patient presents with  . Chest Pain    HPI Jimmy Mcbride is a 47 y.o. male.  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 He reports onset of left sided chest pain yesterday. He denies nausea, vomiting, diaphoresis, shortness of breath, weakness.  No cardiac history. He used ibuprofen last night with moderate relief. He has not repeated the dosing today. He reports tenderness along the left lateral rib margin and across the anterior aspect of his upper chest.   The history is provided by the patient. No language interpreter was used.  Chest Pain   This is a new problem. The current episode started yesterday. The problem has not changed since onset.The pain is present in the lateral region. The pain is moderate. The symptoms are aggravated by deep breathing and certain positions. Pertinent negatives include no abdominal pain, no back pain, no diaphoresis, no exertional chest pressure, no nausea, no shortness of breath, no vomiting and no weakness. There are no known risk factors.    Past Medical History:  Diagnosis Date  . Asthma    in childhood  . Kidney stone     There are no active problems to display for this patient.   Past Surgical History:  Procedure Laterality Date  . INCISION AND DRAINAGE Left 02/02/2013   Procedure: INCISION AND dedribment;  Surgeon: Dominica Severin, MD;  Location: [facility];  Service: Orthopedics;  Laterality: Left;  . NERVE, TENDON AND ARTERY REPAIR Left 02/02/2013   Procedure: Extensor Pollicis Longus TENDON repair;  Surgeon: Dominica Severin, MD;  Location: [facility];  Service: Orthopedics;  Laterality: Left;  . OPEN REDUCTION INTERNAL FIXATION (ORIF) METACARPAL Left 02/02/2013   Procedure: OPEN REDUCTION INTERNAL FIXATION (ORIF) proximal  thumb;  Surgeon: Dominica Severin, MD;  Location: [facility];  Service: Orthopedics;  Laterality: Left;       Home Medications    Prior to Admission medications   Medication Sig Start Date End Date Taking? Authorizing Provider  ALPRAZolam Prudy Feeler) 1 MG tablet Take 1 mg by mouth at bedtime as needed for sleep.    [provider]  cyclobenzaprine (FLEXERIL) 10 MG tablet Take 1 tablet (10 mg total) by mouth 3 (three) times daily as needed for muscle spasms. 11/22/15   Azalia Bilis, MD  ibuprofen (ADVIL,MOTRIN) 600 MG tablet Take 1 tablet (600 mg total) by mouth every 8 (eight) hours as needed. 11/22/15   Azalia Bilis, MD  ondansetron (ZOFRAN ODT) 8 MG disintegrating tablet Take 1 tablet (8 mg total) by mouth every 8 (eight) hours as needed for nausea or vomiting. 11/22/15   Azalia Bilis, MD  promethazine (PHENERGAN) 25 MG tablet Take 1 tablet (25 mg total) by mouth every 6 (six) hours as needed for nausea. 05/30/14 11/22/15  Geoffery Lyons, MD    Family History History reviewed. No pertinent family history.  Social History Social History   Tobacco Use  . Smoking status: Current Every Day Smoker    Packs/day: 0.50    Years: 15.00    Pack years: 7.50    Types: Cigars  . Smokeless tobacco: Never Used  Substance Use Topics  . Alcohol use: No  . Drug use: Yes    Types: Marijuana     Allergies   Penicillins   Review of Systems  Review of Systems  Constitutional: Negative for diaphoresis.  Respiratory: Negative for shortness of breath.   Cardiovascular: Positive for chest pain.  Gastrointestinal: Negative for abdominal pain, nausea and vomiting.  Musculoskeletal: Negative for back pain.  Neurological: Negative for weakness.  All other systems reviewed and are negative.    Physical Exam Updated Vital Signs BP 115/86 (BP Location: Left Arm)   Pulse 70   Temp 98.2 F (36.8 C) (Oral)   Resp 18   Ht 5\' 7"  (1.702 m)   Wt 68.9 kg (152 lb)   SpO2 100%   BMI 23.81 kg/m    Physical Exam  Constitutional: He is oriented to person, place, and time. He appears well-developed and well-nourished.  HENT:  Head: Atraumatic.  Eyes: Conjunctivae are normal.  Neck: Neck supple.  Cardiovascular: Normal rate, regular rhythm, intact distal pulses and normal pulses.  Pulmonary/Chest: Effort normal and breath sounds normal.  Abdominal: Soft.  Musculoskeletal: Normal range of motion.       Right lower leg: He exhibits no edema.  Neurological: He is alert and oriented to person, place, and time.  Skin: Skin is warm and dry.  Psychiatric: He has a normal mood and affect.  Nursing note and vitals reviewed.    ED Treatments / Results  Labs (all labs ordered are listed, but only abnormal results are displayed) Labs Reviewed - No data to display  EKG  EKG Interpretation  Date/Time:  Tuesday October 07 2017 13:40:40 EST Ventricular Rate:  67 PR Interval:  176 QRS Duration: 86 QT Interval:  398 QTC Calculation: 420 R Axis:   100 Text Interpretation:  Normal sinus rhythm Rightward axis Cannot rule out Anterior infarct , age undetermined Abnormal ECG since last tracing no significant change Confirmed by Rolan BuccoBelfi, Melanie [phone]([id number]) on 10/07/2017 2:56:22 PM       Radiology Dg Chest 2 View  Result Date: 10/07/2017 CLINICAL DATA:  47 year-old male c/o LEFT lower rib pain since yesterday. Pt denies injury or other chest symptoms. EXAM: CHEST  2 VIEW COMPARISON:  02/02/2013 FINDINGS: Normal heart, mediastinum and hila. The lungs are clear. No pleural effusion or pneumothorax. Skeletal structures are unremarkable. IMPRESSION: 1. Normal chest radiographs. Electronically Signed   By: Amie Portlandavid  Ormond M.D.   On: 10/07/2017 15:53    Procedures Procedures (including critical care time)  Medications Ordered in ED Medications - No data to display   Initial Impression / Assessment and Plan / ED Course  I have reviewed the triage vital signs and the nursing notes.  Pertinent  labs & imaging results that were available during my care of the patient were reviewed by me and considered in my medical decision making (see chart for details).    Chest pain appears to be musculo-skeletal in origin. Chest pain is not likely of cardiac or pulmonary etiology d/t presentation, perc negative, VSS, no tracheal deviation, no JVD or new murmur, RRR, breath sounds equal bilaterally, EKG without acute abnormalities,  and negative CXR. Pt has been advised to return to the ED is CP becomes exertional, associated with diaphoresis or nausea, radiates to left jaw/arm, worsens or becomes concerning in any way. Pt appears reliable for follow up and is agreeable to discharge.   Case has been discussed with Dr. Rush Landmarkegeler who agrees with the above plan to discharge.   Final Clinical Impressions(s) / ED Diagnoses   Final diagnoses:  Chest wall pain    ED Discharge Orders        Ordered  naproxen (NAPROSYN) 375 MG tablet  2 times daily     10/07/17 1713       Felicie Morn, NP 10/07/17 1717    Tegeler, Canary Brim, MD 10/08/17 [phone]

## 2018-06-08 ENCOUNTER — Other Ambulatory Visit: Payer: Self-pay

## 2018-06-08 ENCOUNTER — Emergency Department (HOSPITAL_BASED_OUTPATIENT_CLINIC_OR_DEPARTMENT_OTHER)
Admission: EM | Admit: 2018-06-08 | Discharge: 2018-06-08 | Disposition: A | Discharge status: Home / Self Care | Payer: BLUE CROSS/BLUE SHIELD | Attending: Emergency Medicine | Admitting: Emergency Medicine

## 2018-06-08 ENCOUNTER — Encounter (HOSPITAL_BASED_OUTPATIENT_CLINIC_OR_DEPARTMENT_OTHER): Payer: Self-pay | Admitting: *Deleted

## 2018-06-08 DIAGNOSIS — Z79899 Other long term (current) drug therapy: Secondary | ICD-10-CM | POA: Diagnosis not present

## 2018-06-08 DIAGNOSIS — K0889 Other specified disorders of teeth and supporting structures: Secondary | ICD-10-CM | POA: Insufficient documentation

## 2018-06-08 DIAGNOSIS — F17210 Nicotine dependence, cigarettes, uncomplicated: Secondary | ICD-10-CM | POA: Insufficient documentation

## 2018-06-08 MED ORDER — CLINDAMYCIN HCL 150 MG PO CAPS: 450.0000 mg | ORAL_CAPSULE | Freq: Three times a day (TID) | ORAL | 0 refills | Status: DC

## 2018-06-08 MED ORDER — CLINDAMYCIN HCL 150 MG PO CAPS
450.0000 mg | ORAL_CAPSULE | Freq: Once | ORAL | Status: AC
Start: 2018-06-08 — End: 2018-06-08
  Administered 2018-06-08: 450 mg via ORAL
  Filled 2018-06-08: qty 3

## 2018-06-08 NOTE — ED Triage Notes (Signed)
Pt c/o right dental pain x 3 days, right jaw swelling x 1 day

## 2018-06-08 NOTE — ED Provider Notes (Signed)
[facility] EMERGENCY DEPARTMENT Provider Note   CSN: [id number] Arrival date & time: 06/08/18  1956     History   Chief Complaint Chief Complaint  Patient presents with  . Dental Pain    HPI Adelbert Gaspard is a 47 y.o. male who presents with a 3-day history of dental pain.  He has had associated swelling to his right lower jaw.  He denies associated right ear pain.  He denies any fevers or neck pain.  He has been taking Tylenol and ketorolac, which he has been taking for a back problem.  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  HPI  Past Medical History:  Diagnosis Date  . Asthma    in childhood  . Kidney stone     There are no active problems to display for this patient.   Past Surgical History:  Procedure Laterality Date  . INCISION AND DRAINAGE Left 02/02/2013   Procedure: INCISION AND dedribment;  Surgeon: Dominica Severin, MD;  Location: [facility];  Service: Orthopedics;  Laterality: Left;  . NERVE, TENDON AND ARTERY REPAIR Left 02/02/2013   Procedure: Extensor Pollicis Longus TENDON repair;  Surgeon: Dominica Severin, MD;  Location: [facility];  Service: Orthopedics;  Laterality: Left;  . OPEN REDUCTION INTERNAL FIXATION (ORIF) METACARPAL Left 02/02/2013   Procedure: OPEN REDUCTION INTERNAL FIXATION (ORIF) proximal thumb;  Surgeon: Dominica Severin, MD;  Location: [facility];  Service: Orthopedics;  Laterality: Left;        Home Medications    Prior to Admission medications   Medication Sig Start Date End Date Taking? Authorizing Provider  cyclobenzaprine (FLEXERIL) 10 MG tablet Take 1 tablet (10 mg total) by mouth 3 (three) times daily as needed for muscle spasms. 11/22/15  Yes Azalia Bilis, MD  ALPRAZolam Prudy Feeler) 1 MG tablet Take 1 mg by mouth at bedtime as needed for sleep.    [provider]  clindamycin (CLEOCIN) 150 MG capsule Take 3 capsules (450 mg total) by mouth 3 (three) times daily. 06/08/18   Jenayah Antu, Waylan Boga, PA-C  ibuprofen (ADVIL,MOTRIN) 600 MG tablet  Take 1 tablet (600 mg total) by mouth every 8 (eight) hours as needed. 11/22/15   Azalia Bilis, MD  naproxen (NAPROSYN) 375 MG tablet Take 1 tablet (375 mg total) by mouth 2 (two) times daily. 10/07/17   Felicie Morn, NP  ondansetron (ZOFRAN ODT) 8 MG disintegrating tablet Take 1 tablet (8 mg total) by mouth every 8 (eight) hours as needed for nausea or vomiting. 11/22/15   Azalia Bilis, MD    Family History History reviewed. No pertinent family history.  Social History Social History   Tobacco Use  . Smoking status: Current Every Day Smoker    Packs/day: 0.50    Years: 15.00    Pack years: 7.50    Types: Cigars  . Smokeless tobacco: Never Used  Substance Use Topics  . Alcohol use: No  . Drug use: Yes    Types: Marijuana     Allergies   Penicillins   Review of Systems Review of Systems  Constitutional: Negative for fever.  HENT: Positive for dental problem and ear pain.   Musculoskeletal: Negative for neck pain.     Physical Exam Updated Vital Signs BP 112/86   Pulse 62   Temp 99.7 F (37.6 C)   Resp 16   Ht 5\' 7"  (1.702 m)   Wt 67.6 kg   SpO2 100%   BMI 23.34 kg/m   Physical Exam  Constitutional: He appears well-developed  and well-nourished. No distress.  HENT:  Head: Normocephalic and atraumatic.  Right Ear: Tympanic membrane normal.  Left Ear: Tympanic membrane normal.  Mouth/Throat: Oropharynx is clear and moist. No trismus in the jaw. Abnormal dentition (poor). No dental abscesses or uvula swelling. No oropharyngeal exudate.    Mild edema noted to the right jaw, no submandibular tenderness or masses  Eyes: Pupils are equal, round, and reactive to light. Conjunctivae are normal. Right eye exhibits no discharge. Left eye exhibits no discharge. No scleral icterus.  Neck: Normal range of motion. Neck supple. No thyromegaly present.  Cardiovascular: Normal rate, regular rhythm, normal heart sounds and intact distal pulses. Exam reveals no gallop and no  friction rub.  No murmur heard. Pulmonary/Chest: Effort normal and breath sounds normal. No stridor. No respiratory distress. He has no wheezes. He has no rales.  Musculoskeletal: He exhibits no edema.  Lymphadenopathy:    He has no cervical adenopathy.  Neurological: He is alert. Coordination normal.  Skin: Skin is warm and dry. No rash noted. He is not diaphoretic. No pallor.  Psychiatric: He has a normal mood and affect.  Nursing note and vitals reviewed.    ED Treatments / Results  Labs (all labs ordered are listed, but only abnormal results are displayed) Labs Reviewed - No data to display  EKG None  Radiology No results found.  Procedures Procedures (including critical care time)  Medications Ordered in ED Medications  clindamycin (CLEOCIN) capsule 450 mg (450 mg Oral Given 06/08/18 2317)     Initial Impression / Assessment and Plan / ED Course  I have reviewed the triage vital signs and the nursing notes.  Pertinent labs & imaging results that were available during my care of the patient were reviewed by me and considered in my medical decision making (see chart for details).     Patient with dentalgia.  No abscess requiring immediate incision and drainage.  I offered attempt of a single stab incision with an 18-gauge to attempt drainage to the small fluctuant area, however patient declined and would rather just have antibiotics.  Exam not concerning for Ludwig's angina or pharyngeal abscess.  Will treat with clindamycin, as patient reports allergy to penicillin.  Advised to take probiotic with clindamycin.  Continue ketorolac and Tylenol for pain control.  Pt instructed to follow-up with dentist.  Patient given resources.  Discussed return precautions.  Patient understands and agrees with plan.  Patient vitals stable throughout ED course and discharged in satisfactory condition.  Final Clinical Impressions(s) / ED Diagnoses   Final diagnoses:  Pain, dental    ED  Discharge Orders         Ordered    clindamycin (CLEOCIN) 150 MG capsule  3 times daily     06/08/18 2309           Emi Holes, PA-C 06/08/18 2356    Raeford Razor, MD 06/17/18 1306

## 2018-06-08 NOTE — ED Notes (Signed)
Pt verbalizes understanding of d/c instructions and denies any further needs at this time. 

## 2018-06-08 NOTE — Discharge Instructions (Signed)
Take clindamycin as prescribed.  I recommend taking this with a probiotic to help prevent stomach upset.  This can be found over-the-counter.  Continue taking your ketorolac as prescribed for your pain.  You can alternate with Tylenol.  Please see dentist as soon as possible, as a dentist as the definitive treatment for this problem.  Please return the emergency department if you develop any new or worsening symptoms including masses in your neck, inability to open your mouth or swallow, or any other new or concerning symptom.

## 2018-06-08 NOTE — ED Notes (Signed)
ED Provider at bedside. 

## 2019-06-23 ENCOUNTER — Emergency Department (HOSPITAL_BASED_OUTPATIENT_CLINIC_OR_DEPARTMENT_OTHER)
Admission: EM | Admit: 2019-06-23 | Discharge: 2019-06-23 | Disposition: A | Discharge status: Home / Self Care | Payer: BC Managed Care – PPO | Attending: Emergency Medicine | Admitting: Emergency Medicine

## 2019-06-23 ENCOUNTER — Other Ambulatory Visit: Payer: Self-pay

## 2019-06-23 ENCOUNTER — Encounter (HOSPITAL_BASED_OUTPATIENT_CLINIC_OR_DEPARTMENT_OTHER): Payer: Self-pay | Admitting: *Deleted

## 2019-06-23 DIAGNOSIS — F17210 Nicotine dependence, cigarettes, uncomplicated: Secondary | ICD-10-CM | POA: Insufficient documentation

## 2019-06-23 DIAGNOSIS — J45909 Unspecified asthma, uncomplicated: Secondary | ICD-10-CM | POA: Diagnosis not present

## 2019-06-23 DIAGNOSIS — K047 Periapical abscess without sinus: Secondary | ICD-10-CM | POA: Diagnosis not present

## 2019-06-23 DIAGNOSIS — K0889 Other specified disorders of teeth and supporting structures: Secondary | ICD-10-CM | POA: Diagnosis present

## 2019-06-23 MED ORDER — ACETAMINOPHEN 500 MG PO TABS: 1000.0000 mg | ORAL_TABLET | Freq: Three times a day (TID) | ORAL | 0 refills | Status: AC | PRN

## 2019-06-23 MED ORDER — CLINDAMYCIN HCL 150 MG PO CAPS: 450.0000 mg | ORAL_CAPSULE | Freq: Three times a day (TID) | ORAL | 0 refills | Status: AC

## 2019-06-23 MED ORDER — NAPROXEN 500 MG PO TABS: 500.0000 mg | ORAL_TABLET | Freq: Two times a day (BID) | ORAL | 0 refills | Status: AC

## 2019-06-23 NOTE — ED Triage Notes (Signed)
Pt c/o dental pain x 1 day  

## 2019-06-23 NOTE — Discharge Instructions (Signed)
Take clindamycin as prescribed until completed.  You can alternate with Naprosyn and Tylenol as prescribed, as needed for your pain.  Please follow-up with a dentist as soon as possible for further evaluation and treatment.  Please return the emergency department you develop any fevers over 100.4, lockjaw, significant increasing swelling, or any other concerning symptoms.

## 2019-06-23 NOTE — ED Provider Notes (Signed)
[facility] Provider Note   CSN: [id number] Arrival date & time: 06/23/19  1922     History   Chief Complaint Chief Complaint  Patient presents with  . Dental Pain    HPI Jimmy Mcbride is a 48 y.o. male who presents with a 1 day history of right lower dental pain.  Patient has had similar pain before and given antibiotics.  He reports he had a dental appointment prior to [place], but he ended up having it canceled on him.  He has been taking ibuprofen at home for symptoms.  He denies any lockjaw or fever.     HPI  Past Medical History:  Diagnosis Date  . Asthma    in childhood  . Kidney stone     There are no active problems to display for this patient.   Past Surgical History:  Procedure Laterality Date  . INCISION AND DRAINAGE Left 02/02/2013   Procedure: INCISION AND dedribment;  Surgeon: Roseanne Kaufman, MD;  Location: [facility];  Service: Orthopedics;  Laterality: Left;  . NERVE, TENDON AND ARTERY REPAIR Left 02/02/2013   Procedure: Extensor Pollicis Longus TENDON repair;  Surgeon: Roseanne Kaufman, MD;  Location: [facility];  Service: Orthopedics;  Laterality: Left;  . OPEN REDUCTION INTERNAL FIXATION (ORIF) METACARPAL Left 02/02/2013   Procedure: OPEN REDUCTION INTERNAL FIXATION (ORIF) proximal thumb;  Surgeon: Roseanne Kaufman, MD;  Location: [facility];  Service: Orthopedics;  Laterality: Left;        Home Medications    Prior to Admission medications   Medication Sig Start Date End Date Taking? Authorizing Provider  acetaminophen (TYLENOL) 500 MG tablet Take 2 tablets (1,000 mg total) by mouth every 8 (eight) hours as needed for moderate pain. 06/23/19   Tawanna Funk, Bea Graff, PA-C  ALPRAZolam Duanne Moron) 1 MG tablet Take 1 mg by mouth at bedtime as needed for sleep.    [provider]  clindamycin (CLEOCIN) 150 MG capsule Take 3 capsules (450 mg total) by mouth 3 (three) times daily for 7 days. 06/23/19 06/30/19  Frederica Kuster, PA-C   cyclobenzaprine (FLEXERIL) 10 MG tablet Take 1 tablet (10 mg total) by mouth 3 (three) times daily as needed for muscle spasms. 11/22/15   Jola Schmidt, MD  ibuprofen (ADVIL,MOTRIN) 600 MG tablet Take 1 tablet (600 mg total) by mouth every 8 (eight) hours as needed. 11/22/15   Jola Schmidt, MD  naproxen (NAPROSYN) 500 MG tablet Take 1 tablet (500 mg total) by mouth 2 (two) times daily. 06/23/19   Ariatna Jester, Bea Graff, PA-C  ondansetron (ZOFRAN ODT) 8 MG disintegrating tablet Take 1 tablet (8 mg total) by mouth every 8 (eight) hours as needed for nausea or vomiting. 11/22/15   Jola Schmidt, MD    Family History No family history on file.  Social History Social History   Tobacco Use  . Smoking status: Current Every Day Smoker    Packs/day: 0.50    Years: 15.00    Pack years: 7.50    Types: Cigars  . Smokeless tobacco: Never Used  Substance Use Topics  . Alcohol use: No  . Drug use: Yes    Types: Marijuana     Allergies   Penicillins   Review of Systems Review of Systems  Constitutional: Negative for fever.  HENT: Positive for dental problem.      Physical Exam Updated Vital Signs BP 128/79   Pulse (!) 57   Temp 98.6 F (37 C)   Resp 14  Ht 5\' 7"  (1.702 m)   Wt 70.8 kg   SpO2 100%   BMI 24.43 kg/m   Physical Exam Vitals signs and nursing note reviewed.  Constitutional:      General: He is not in acute distress.    Appearance: He is well-developed. He is not diaphoretic.  HENT:     Head: Normocephalic and atraumatic.     Mouth/Throat:     Pharynx: No oropharyngeal exudate.      Comments: No submandibular masses or tenderness; no trismus Eyes:     General: No scleral icterus.       Right eye: No discharge.        Left eye: No discharge.     Conjunctiva/sclera: Conjunctivae normal.     Pupils: Pupils are equal, round, and reactive to light.  Neck:     Musculoskeletal: Normal range of motion and neck supple.     Thyroid: No thyromegaly.  Cardiovascular:      Rate and Rhythm: Normal rate and regular rhythm.     Heart sounds: Normal heart sounds. No murmur. No friction rub. No gallop.   Pulmonary:     Effort: Pulmonary effort is normal. No respiratory distress.     Breath sounds: Normal breath sounds. No stridor. No wheezing or rales.  Abdominal:     General: Bowel sounds are normal. There is no distension.     Palpations: Abdomen is soft.     Tenderness: There is no abdominal tenderness. There is no guarding or rebound.  Lymphadenopathy:     Cervical: No cervical adenopathy.  Skin:    General: Skin is warm and dry.     Coloration: Skin is not pale.     Findings: No rash.  Neurological:     Mental Status: He is alert.     Coordination: Coordination normal.      ED Treatments / Results  Labs (all labs ordered are listed, but only abnormal results are displayed) Labs Reviewed - No data to display  EKG None  Radiology No results found.  Procedures Procedures (including critical care time)  Medications Ordered in ED Medications - No data to display   Initial Impression / Assessment and Plan / ED Course  I have reviewed the triage vital signs and the nursing notes.  Pertinent labs & imaging results that were available during my care of the patient were reviewed by me and considered in my medical decision making (see chart for details).        Patient with dentalgia.  Abscess noted, however I offered to attempt I&D and patient declined.  Exam not concerning for Ludwig's angina or pharyngeal abscess.  Will treat with clindamycin. Pt instructed to follow-up with dentist.  Also given other resources.  Discussed return precautions.  Patient understands and agrees with plan.  Patient vital stable throughout ED course and discharged in satisfactory condition peer  Final Clinical Impressions(s) / ED Diagnoses   Final diagnoses:  Dental abscess    ED Discharge Orders         Ordered    clindamycin (CLEOCIN) 150 MG capsule  3  times daily     06/23/19 2054    naproxen (NAPROSYN) 500 MG tablet  2 times daily     06/23/19 2054    acetaminophen (TYLENOL) 500 MG tablet  Every 8 hours PRN     06/23/19 2055           Emi HolesLaw, Darly Massi M, PA-C 06/23/19 2148  Melene Plan, DO 06/23/19 2255

## 2019-07-28 IMAGING — CR DG CHEST 2V
2 series · 2 of 2 positions shown · non-contrast
Comparison: 02/02/2013

CLINICAL DATA: 47 year-old male c/o LEFT lower rib pain since
yesterday. Pt denies injury or other chest symptoms.

EXAM:
CHEST  2 VIEW

[w chest pa]
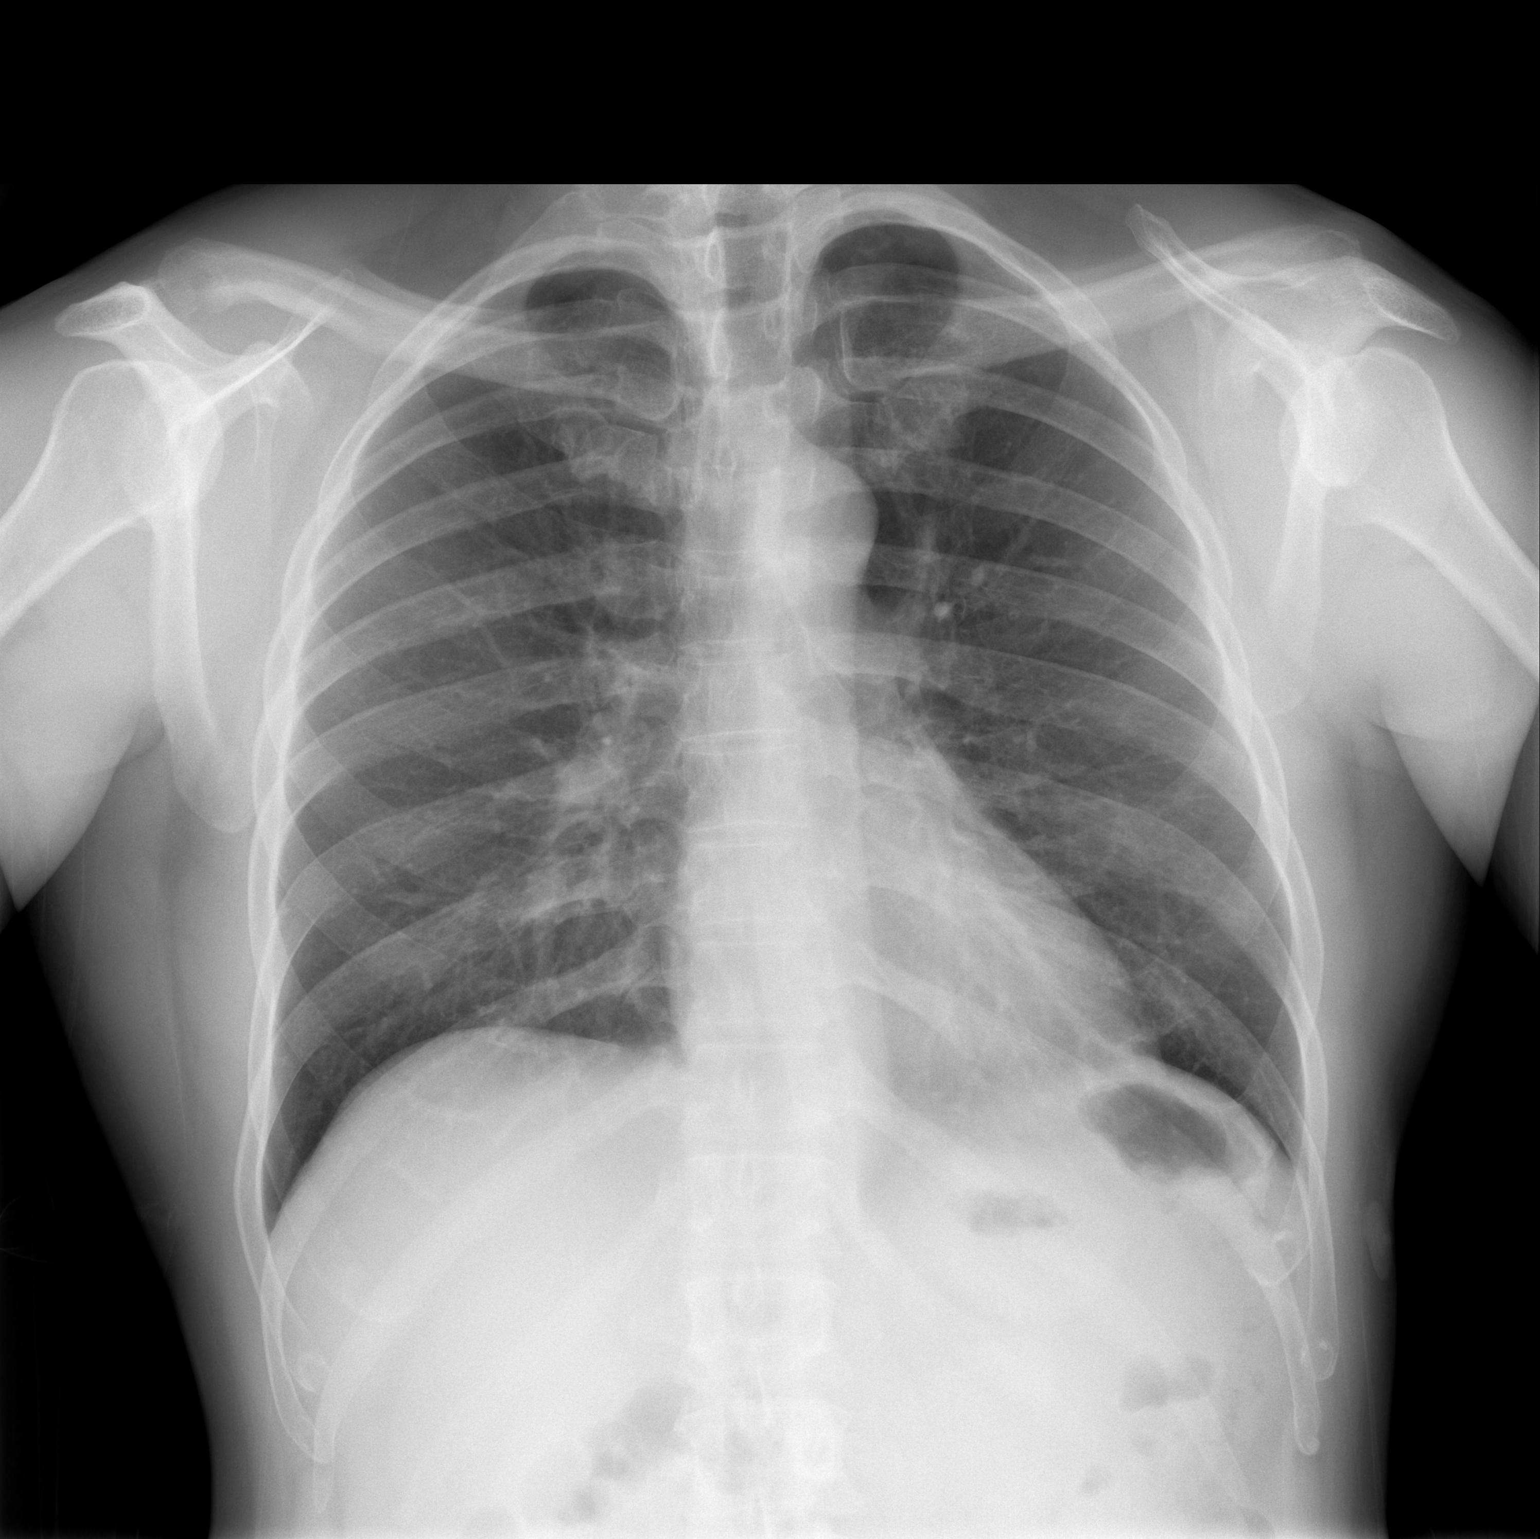

[w chest lat]
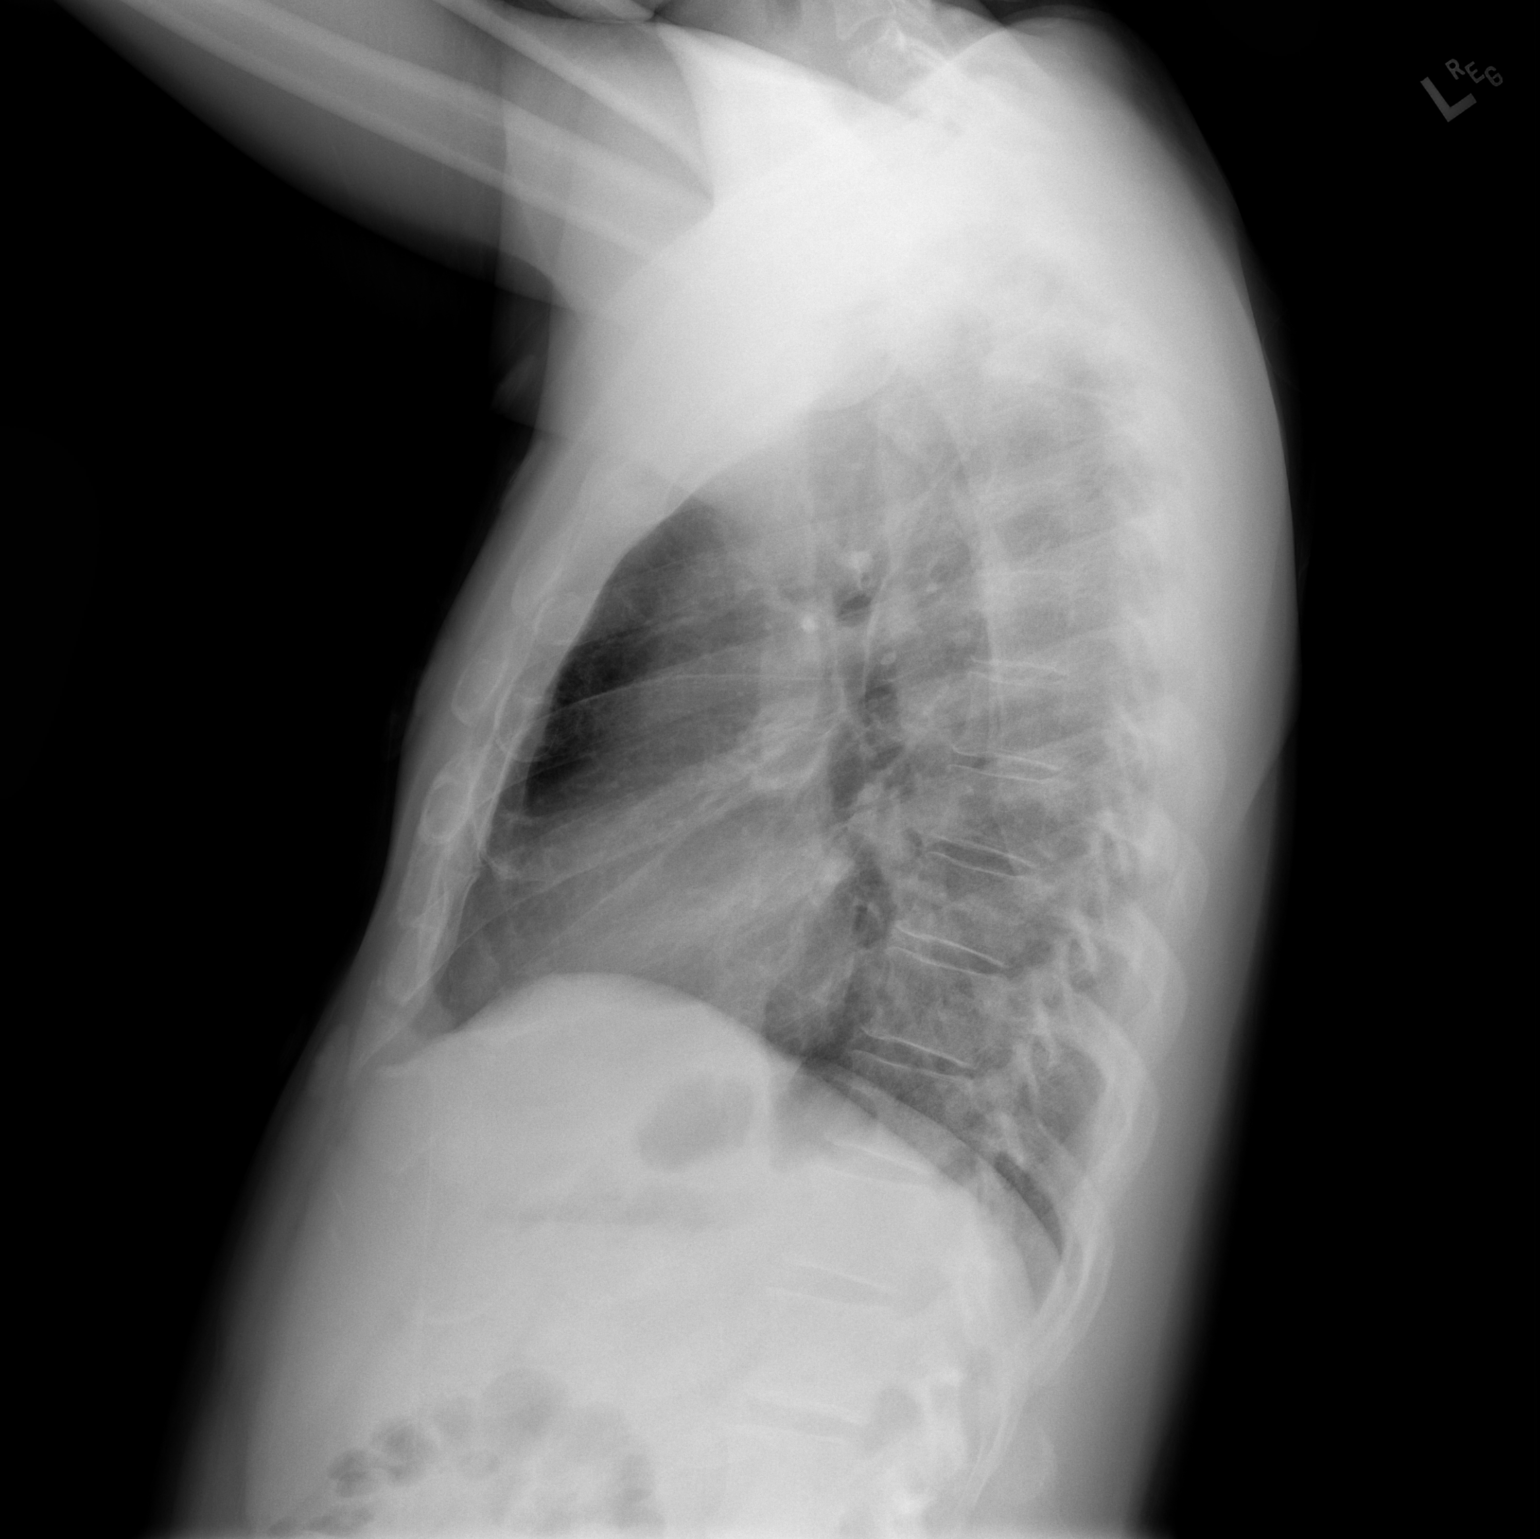

[2 of 2 positions shown; findings below may reference images not displayed]

FINDINGS: Normal heart, mediastinum and hila.

The lungs are clear.

No pleural effusion or pneumothorax.

Skeletal structures are unremarkable.
IMPRESSION: 1. Normal chest radiographs.

## 2023-04-10 ENCOUNTER — Emergency Department (HOSPITAL_BASED_OUTPATIENT_CLINIC_OR_DEPARTMENT_OTHER): Payer: BC Managed Care – PPO | Admitting: Radiology

## 2023-04-10 ENCOUNTER — Emergency Department (HOSPITAL_BASED_OUTPATIENT_CLINIC_OR_DEPARTMENT_OTHER): Payer: BC Managed Care – PPO

## 2023-04-10 ENCOUNTER — Other Ambulatory Visit: Payer: Self-pay

## 2023-04-10 ENCOUNTER — Encounter (HOSPITAL_BASED_OUTPATIENT_CLINIC_OR_DEPARTMENT_OTHER): Payer: Self-pay | Admitting: Emergency Medicine

## 2023-04-10 ENCOUNTER — Emergency Department (HOSPITAL_BASED_OUTPATIENT_CLINIC_OR_DEPARTMENT_OTHER)
Admission: EM | Admit: 2023-04-10 | Discharge: 2023-04-10 | Disposition: A | Discharge status: Home / Self Care | Payer: BC Managed Care – PPO | Attending: Emergency Medicine | Admitting: Emergency Medicine

## 2023-04-10 DIAGNOSIS — R0789 Other chest pain: Secondary | ICD-10-CM | POA: Diagnosis not present

## 2023-04-10 DIAGNOSIS — R079 Chest pain, unspecified: Secondary | ICD-10-CM

## 2023-04-10 DIAGNOSIS — M545 Low back pain, unspecified: Secondary | ICD-10-CM | POA: Diagnosis not present

## 2023-04-10 LAB — BASIC METABOLIC PANEL
Anion gap: 8 (ref 5–15)
BUN: 17 mg/dL (ref 6–20)
CO2: 25 mmol/L (ref 22–32)
Calcium: 9 mg/dL (ref 8.9–10.3)
Chloride: 104 mmol/L (ref 98–111)
Creatinine, Ser: 0.9 mg/dL (ref 0.61–1.24)
GFR, Estimated: >60 mL/min (ref >60)
Glucose, Bld: 103 mg/dL — ABNORMAL HIGH (ref 70–99)
Potassium: 3.8 mmol/L (ref 3.5–5.1)
Sodium: 137 mmol/L (ref 135–145)

## 2023-04-10 LAB — CBC
HCT: 40.5 % (ref 39.0–52.0)
Hemoglobin: 13.4 g/dL (ref 13.0–17.0)
MCH: 23 pg — ABNORMAL LOW (ref 26.0–34.0)
MCHC: 33.1 g/dL (ref 30.0–36.0)
MCV: 69.6 fL — ABNORMAL LOW (ref 80.0–100.0)
Platelets: 272 10*3/uL (ref 150–400)
RBC: 5.82 MIL/uL — ABNORMAL HIGH (ref 4.22–5.81)
RDW: 15.6 % — ABNORMAL HIGH (ref 11.5–15.5)
WBC: 5 10*3/uL (ref 4.0–10.5)
nRBC: 0 % (ref 0.0–0.2)

## 2023-04-10 LAB — TROPONIN I (HIGH SENSITIVITY): Troponin I (High Sensitivity): 3 ng/L (ref <18)

## 2023-04-10 LAB — HEPATIC FUNCTION PANEL
ALT: 17 U/L (ref 0–44)
AST: 19 U/L (ref 15–41)
Albumin: 4.4 g/dL (ref 3.5–5.0)
Alkaline Phosphatase: 75 U/L (ref 38–126)
Bilirubin, Direct: 0.1 mg/dL (ref 0.0–0.2)
Indirect Bilirubin: 0.2 mg/dL — ABNORMAL LOW (ref 0.3–0.9)
Total Bilirubin: 0.3 mg/dL (ref 0.3–1.2)
Total Protein: 7.4 g/dL (ref 6.5–8.1)

## 2023-04-10 NOTE — ED Notes (Signed)
 RN reviewed discharge instructions with pt. Pt verbalized understanding and had no further questions. VSS upon discharge.  

## 2023-04-10 NOTE — ED Notes (Signed)
Pt denies pain at this time

## 2023-04-10 NOTE — ED Notes (Signed)
Spoke with lab to add on hep function panel

## 2023-04-10 NOTE — Discharge Instructions (Addendum)
You are seen in the emergency department for some right-sided chest pain.  You had blood work EKG chest x-ray and an ultrasound of your gallbladder that did not show an obvious explanation for your symptoms.  There was no evidence of heart attack.  This is likely musculoskeletal pain.  You can use ibuprofen and Tylenol, gentle stretching.  Follow-up with your regular doctor.  Return to the emergency department if any worsening or concerning symptoms.

## 2023-04-10 NOTE — ED Notes (Signed)
D/c delta trop per verbal from EDP [employer]

## 2023-04-10 NOTE — ED Triage Notes (Signed)
Patient reports chest pain that started last night, he went to an UC today and was told that "his EKG didn't look right."  Patient denies pain now.

## 2023-04-10 NOTE — ED Provider Notes (Signed)
[place] EMERGENCY DEPARTMENT AT [facility] Provider Note   CSN: [id number] Arrival date & time: 04/10/23  1104     History  Chief Complaint  Patient presents with   Chest Pain    Jimmy Mcbride is a 52 y.o. male.  He has no significant past medical history.  He he said last night he had some sharp stabbing pain in his right chest that wraps around to his right lower back.  Symptoms are mostly resolved today but he went to urgent care to get checked out and had an abnormal EKG and they sent him here for further evaluation.  No shortness of breath but if he takes a deep breath he will still feel the pain.  He can also exacerbate the pain by sitting forward.  No known trauma.  No cough fever nausea vomiting diaphoresis dizziness.  No history of cardiac disease.  He is a smoker.  The history is provided by the patient.  Chest Pain Pain location:  R chest Pain quality: stabbing   Pain radiates to:  Lower back Pain severity:  Moderate Onset quality:  Sudden Timing:  Intermittent Progression:  Resolved Context: at rest   Relieved by:  Certain positions Worsened by:  Certain positions Ineffective treatments:  None tried Associated symptoms: no abdominal pain, no cough, no diaphoresis, no dysphagia, no fever, no nausea, no shortness of breath and no vomiting   Risk factors: smoking        Home Medications Prior to Admission medications   Medication Sig Start Date End Date Taking? Authorizing Provider  acetaminophen (TYLENOL) 500 MG tablet Take 2 tablets (1,000 mg total) by mouth every 8 (eight) hours as needed for moderate pain. 06/23/19   Law, Waylan Boga, PA-C  ALPRAZolam Prudy Feeler) 1 MG tablet Take 1 mg by mouth at bedtime as needed for sleep.    [provider]  cyclobenzaprine (FLEXERIL) 10 MG tablet Take 1 tablet (10 mg total) by mouth 3 (three) times daily as needed for muscle spasms. 11/22/15   Azalia Bilis, MD  ibuprofen (ADVIL,MOTRIN) 600 MG tablet Take  1 tablet (600 mg total) by mouth every 8 (eight) hours as needed. 11/22/15   Azalia Bilis, MD  naproxen (NAPROSYN) 500 MG tablet Take 1 tablet (500 mg total) by mouth 2 (two) times daily. 06/23/19   Law, Waylan Boga, PA-C  ondansetron (ZOFRAN ODT) 8 MG disintegrating tablet Take 1 tablet (8 mg total) by mouth every 8 (eight) hours as needed for nausea or vomiting. 11/22/15   Azalia Bilis, MD      Allergies    Penicillins    Review of Systems   Review of Systems  Constitutional:  Negative for diaphoresis and fever.  HENT:  Negative for trouble swallowing.   Respiratory:  Negative for cough and shortness of breath.   Cardiovascular:  Positive for chest pain.  Gastrointestinal:  Negative for abdominal pain, nausea and vomiting.    Physical Exam Updated Vital Signs BP (!) 132/93 (BP Location: Right Arm)   Pulse 63   Temp 98.7 F (37.1 C)   Resp 18   Ht 5\' 7"  (1.702 m)   Wt 75.3 kg   SpO2 100%   BMI 26.00 kg/m  Physical Exam Vitals and nursing note reviewed.  Constitutional:      General: He is not in acute distress.    Appearance: Normal appearance. He is well-developed.  HENT:     Head: Normocephalic and atraumatic.  Eyes:     Conjunctiva/sclera:  Conjunctivae normal.  Cardiovascular:     Rate and Rhythm: Normal rate and regular rhythm.     Heart sounds: Normal heart sounds. No murmur heard. Pulmonary:     Effort: Pulmonary effort is normal. No respiratory distress.     Breath sounds: Normal breath sounds.  Chest:     Chest wall: Tenderness (Some reproducible tenderness with palpation of his right chest, no crepitus) present.  Abdominal:     Palpations: Abdomen is soft.     Tenderness: There is no abdominal tenderness. There is no guarding or rebound.  Musculoskeletal:        General: No swelling. Normal range of motion.     Cervical back: Neck supple.     Right lower leg: No tenderness. No edema.     Left lower leg: No tenderness. No edema.  Skin:    General: Skin  is warm and dry.     Capillary Refill: Capillary refill takes less than 2 seconds.  Neurological:     General: No focal deficit present.     Mental Status: He is alert.     ED Results / Procedures / Treatments   Labs (all labs ordered are listed, but only abnormal results are displayed) Labs Reviewed  BASIC METABOLIC PANEL - Abnormal; Notable for the following components:      Result Value   Glucose, Bld 103 (*)    All other components within normal limits  CBC - Abnormal; Notable for the following components:   RBC 5.82 (*)    MCV 69.6 (*)    MCH 23.0 (*)    RDW 15.6 (*)    All other components within normal limits  HEPATIC FUNCTION PANEL - Abnormal; Notable for the following components:   Indirect Bilirubin 0.2 (*)    All other components within normal limits  TROPONIN I (HIGH SENSITIVITY)    EKG EKG Interpretation Date/Time:  Thursday April 10 2023 11:15:29 EDT Ventricular Rate:  63 PR Interval:  184 QRS Duration:  82 QT Interval:  402 QTC Calculation: 411 R Axis:   105  Text Interpretation: Normal sinus rhythm Rightward axis Cannot rule out Anterior infarct (cited on or before 07-Oct-2017) Abnormal ECG When compared with ECG of 07-Oct-2017 13:40, No significant change was found Confirmed by Meridee Score [phone]) on 04/10/2023 11:18:15 AM  Radiology US Abdomen Limited RUQ (LIVER/GB)  Result Date: 04/10/2023 CLINICAL DATA:  Chest and right upper quadrant abdominal pain since last night EXAM: ULTRASOUND ABDOMEN LIMITED RIGHT UPPER QUADRANT COMPARISON:  Renal stone protocol CT 11/22/2015 FINDINGS: Gallbladder: Gallstones: None Sludge: None Gallbladder Wall: Within normal limits Pericholecystic fluid: None Sonographic Murphy's Sign: Negative per technologist Common bile duct: Diameter: 4 mm Liver: Parenchymal echogenicity: Within normal limits Contours: Normal Lesions: None Portal vein: Patent.  Hepatopetal flow Other: None. IMPRESSION: No significant sonographic abnormality  of the liver or gallbladder. Electronically Signed   By: Acquanetta Belling M.D.   On: 04/10/2023 14:56   DG Chest 2 View  Result Date: 04/10/2023 CLINICAL DATA:  chest pain EXAM: CHEST - 2 VIEW COMPARISON:  10/07/2017. FINDINGS: Bilateral lung fields are clear. Bilateral costophrenic angles are clear. Normal cardio-mediastinal silhouette. No acute osseous abnormalities. The soft tissues are within normal limits. IMPRESSION: 1. No active cardiopulmonary disease. Electronically Signed   By: Jules Schick M.D.   On: 04/10/2023 11:40    Procedures Procedures    Medications Ordered in ED Medications - No data to display  ED Course/ Medical Decision Making/ A&P  Medical Decision Making Amount and/or Complexity of Data Reviewed Labs: ordered. Radiology: ordered.   This patient complains of right-sided chest pain and flank; this involves an extensive number of treatment Options and is a complaint that carries with it a high risk of complications and morbidity. The differential includes ACS, pneumonia, pneumothorax, PE, biliary colic, musculoskeletal  I ordered, reviewed and interpreted labs, which included CBC normal chemistries normal LFTs normal troponins flat I ordered imaging studies which included chest x-ray and right upper quadrant ultrasound and I independently    visualized and interpreted imaging which showed no acute findings Previous records obtained and reviewed in epic, no recent ED visits Cardiac monitoring reviewed, normal sinus rhythm Social determinants considered, ongoing tobacco use Critical Interventions: None  After the interventions stated above, I reevaluated the patient and found patient's pain to be much improved since yesterday Admission and further testing considered, no indications for admission or further workup at this time.  Recommended symptomatic treatment with NSAIDs and local care, follow-up PCP.  Return instructions  discussed         Final Clinical Impression(s) / ED Diagnoses Final diagnoses:  Nonspecific chest pain    Rx / DC Orders ED Discharge Orders     None         Terrilee Files, MD 04/10/23 1754

## 2024-07-05 ENCOUNTER — Emergency Department (HOSPITAL_BASED_OUTPATIENT_CLINIC_OR_DEPARTMENT_OTHER)
Admission: EM | Admit: 2024-07-05 | Discharge: 2024-07-05 | Disposition: A | Discharge status: Home / Self Care | Attending: Emergency Medicine | Admitting: Emergency Medicine

## 2024-07-05 ENCOUNTER — Other Ambulatory Visit: Payer: Self-pay

## 2024-07-05 ENCOUNTER — Encounter (HOSPITAL_BASED_OUTPATIENT_CLINIC_OR_DEPARTMENT_OTHER): Payer: Self-pay

## 2024-07-05 DIAGNOSIS — B029 Zoster without complications: Secondary | ICD-10-CM | POA: Insufficient documentation

## 2024-07-05 DIAGNOSIS — R21 Rash and other nonspecific skin eruption: Secondary | ICD-10-CM | POA: Diagnosis present

## 2024-07-05 MED ORDER — VALACYCLOVIR HCL 1 G PO TABS: 1000.0000 mg | ORAL_TABLET | Freq: Three times a day (TID) | ORAL | 0 refills | Status: AC

## 2024-07-05 MED ORDER — GABAPENTIN 100 MG PO CAPS: 100.0000 mg | ORAL_CAPSULE | Freq: Three times a day (TID) | ORAL | 0 refills | Status: AC

## 2024-07-05 NOTE — Discharge Instructions (Addendum)
For your pain, you may take up to 1000mg of acetaminophen (tylenol) 4 times daily for up to a week. This is the maximum dose of acetminophen (tylenol) you can take from all sources. Please check other over-the-counter medications and prescriptions to ensure you are not taking other medications that contain acetaminophen.  You may also take ibuprofen 400 mg 6 times a day OR 600mg 4 times a day alternating with or at the same time as tylenol.   

## 2024-07-05 NOTE — ED Provider Notes (Signed)
 [place] EMERGENCY DEPARTMENT AT [facility] Provider Note   CSN: [id number] Arrival date & time: 07/05/24  9366     Patient presents with: Rash   Jimmy Mcbride is a 53 y.o. male.   HPI     ` 53 year old male with no significant medical history presents with concern for rash.  Rash began on Saturday has bumps over the right side of his neck extending onto his head and ear.  Describes the pain as both a burning and itching.  He is having difficulty sleeping at night due to the pain.  He denies any vision changes, fever, nausea, vomiting, or other concerns.  Denies having the rash in any other location.  Prior to Admission medications   Medication Sig Start Date End Date Taking? Authorizing Provider  gabapentin (NEURONTIN) 100 MG capsule Take 1 capsule (100 mg total) by mouth 3 (three) times daily for 14 days. 07/05/24 07/19/24 Yes Dreama Longs, MD  valACYclovir (VALTREX) 1000 MG tablet Take 1 tablet (1,000 mg total) by mouth 3 (three) times daily for 10 days. 07/05/24 07/15/24 Yes Dreama Longs, MD  acetaminophen  (TYLENOL ) 500 MG tablet Take 2 tablets (1,000 mg total) by mouth every 8 (eight) hours as needed for moderate pain. 06/23/19   Law, Alexandra M, PA-C  ALPRAZolam  (XANAX ) 1 MG tablet Take 1 mg by mouth at bedtime as needed for sleep.    [provider]  cyclobenzaprine  (FLEXERIL ) 10 MG tablet Take 1 tablet (10 mg total) by mouth 3 (three) times daily as needed for muscle spasms. 11/22/15   Baxter Drivers, MD  ibuprofen  (ADVIL ,MOTRIN ) 600 MG tablet Take 1 tablet (600 mg total) by mouth every 8 (eight) hours as needed. 11/22/15   Baxter Drivers, MD  naproxen  (NAPROSYN ) 500 MG tablet Take 1 tablet (500 mg total) by mouth 2 (two) times daily. 06/23/19   Law, Lorane HERO, PA-C  ondansetron  (ZOFRAN  ODT) 8 MG disintegrating tablet Take 1 tablet (8 mg total) by mouth every 8 (eight) hours as needed for nausea or vomiting. 11/22/15   Baxter Drivers, MD     Allergies: Penicillins    Review of Systems  Updated Vital Signs BP (!) 125/94 (BP Location: Right Arm)   Pulse 69   Temp 98.1 F (36.7 C) (Oral)   Resp 16   Ht 5' 7 (1.702 m)   Wt 71.2 kg   SpO2 100%   BMI 24.59 kg/m   Physical Exam Vitals and nursing note reviewed.  Constitutional:      General: He is not in acute distress.    Appearance: He is well-developed. He is not diaphoretic.  HENT:     Head: Normocephalic and atraumatic.     Comments: Vesicular rash right side of neck extending top of chest, right jaw, extension onto auricle, no lesions in canal.  No surrounding erythema Normal pupils, normal conjunctiva, no facial lesions beyond the area of the mandible  Eyes:     Conjunctiva/sclera: Conjunctivae normal.  Cardiovascular:     Rate and Rhythm: Normal rate and regular rhythm.     Pulses: Normal pulses.  Pulmonary:     Effort: Pulmonary effort is normal. No respiratory distress.  Musculoskeletal:     Cervical back: Normal range of motion.  Skin:    General: Skin is warm and dry.  Neurological:     Mental Status: He is alert and oriented to person, place, and time.     (all labs ordered are listed, but only abnormal results are  displayed) Labs Reviewed - No data to display  EKG: None  Radiology: No results found.   Procedures   Medications Ordered in the ED - No data to display                                    53 year old male with no significant medical history presents with concern for rash.  Rash appearance and distribution is consistent with shingles.  He does not have signs of other complications at this time, no signs of encephalitis, meningitis, no signs of ocular involvement, and no signs of ear canal lesions or [place]. No signs of superimposed bacterial infection.   Discussed pain control options--given rx for valacyclovir, gabapentin and recommend tylenol /ibuprofen .  Recommend PCP follow up. Patient discharged in stable  condition with understanding of reasons to return.        Final diagnoses:  Herpes zoster without complication    ED Discharge Orders          Ordered    valACYclovir (VALTREX) 1000 MG tablet  3 times daily        07/05/24 0722    gabapentin (NEURONTIN) 100 MG capsule  3 times daily        07/05/24 9277               Dreama Longs, MD 07/05/24 [phone]

## 2024-07-05 NOTE — ED Triage Notes (Signed)
 Pt reports bumpy red rash on right side of his face  going into neck and up into scalp beginning Saturday night and getting progressively worse. Pt denies any other symptoms. NAD noted.
